# Patient Record
Sex: Female | Born: 1950
Health system: Southern US, Community
[De-identification: ages and names within clinical notes are randomized; demographics above are authoritative.]

## PROBLEM LIST (undated history)

## (undated) DIAGNOSIS — I10 Essential (primary) hypertension: Secondary | ICD-10-CM

## (undated) DIAGNOSIS — E7800 Pure hypercholesterolemia, unspecified: Secondary | ICD-10-CM

---

## 1998-02-02 ENCOUNTER — Other Ambulatory Visit: Admission: RE | Admit: 1998-02-02 | Discharge: 1998-02-02 | Payer: Self-pay | Admitting: Obstetrics and Gynecology

## 1999-01-31 ENCOUNTER — Other Ambulatory Visit: Admission: RE | Admit: 1999-01-31 | Discharge: 1999-01-31 | Payer: Self-pay | Admitting: Obstetrics and Gynecology

## 1999-06-11 ENCOUNTER — Emergency Department (HOSPITAL_COMMUNITY): Admission: EM | Admit: 1999-06-11 | Discharge: 1999-06-11 | Payer: Self-pay | Admitting: Emergency Medicine

## 2000-07-07 ENCOUNTER — Encounter: Payer: Self-pay | Admitting: Emergency Medicine

## 2000-07-07 ENCOUNTER — Emergency Department (HOSPITAL_COMMUNITY): Admission: EM | Admit: 2000-07-07 | Discharge: 2000-07-07 | Payer: Self-pay | Admitting: Emergency Medicine

## 2000-08-28 ENCOUNTER — Other Ambulatory Visit: Admission: RE | Admit: 2000-08-28 | Discharge: 2000-08-28 | Payer: Self-pay | Admitting: Family Medicine

## 2001-10-16 ENCOUNTER — Other Ambulatory Visit: Admission: RE | Admit: 2001-10-16 | Discharge: 2001-10-16 | Payer: Self-pay | Admitting: Family Medicine

## 2001-10-23 ENCOUNTER — Encounter: Payer: Self-pay | Admitting: Family Medicine

## 2001-10-23 ENCOUNTER — Ambulatory Visit (HOSPITAL_COMMUNITY): Admission: RE | Admit: 2001-10-23 | Discharge: 2001-10-23 | Payer: Self-pay | Admitting: Family Medicine

## 2002-11-12 ENCOUNTER — Ambulatory Visit (HOSPITAL_COMMUNITY): Admission: RE | Admit: 2002-11-12 | Discharge: 2002-11-12 | Payer: Self-pay | Admitting: Family Medicine

## 2002-11-12 ENCOUNTER — Other Ambulatory Visit: Admission: RE | Admit: 2002-11-12 | Discharge: 2002-11-12 | Payer: Self-pay | Admitting: Obstetrics and Gynecology

## 2002-11-12 ENCOUNTER — Encounter: Payer: Self-pay | Admitting: Family Medicine

## 2004-01-26 ENCOUNTER — Encounter: Admission: RE | Admit: 2004-01-26 | Discharge: 2004-01-26 | Payer: Self-pay | Admitting: Family Medicine

## 2004-04-06 ENCOUNTER — Ambulatory Visit: Payer: Self-pay | Admitting: Family Medicine

## 2004-04-06 ENCOUNTER — Other Ambulatory Visit: Admission: RE | Admit: 2004-04-06 | Discharge: 2004-04-06 | Payer: Self-pay | Admitting: Family Medicine

## 2004-05-18 ENCOUNTER — Ambulatory Visit: Payer: Self-pay | Admitting: Family Medicine

## 2004-05-20 ENCOUNTER — Ambulatory Visit: Payer: Self-pay | Admitting: Family Medicine

## 2004-05-26 ENCOUNTER — Ambulatory Visit: Payer: Self-pay

## 2004-06-01 ENCOUNTER — Ambulatory Visit: Payer: Self-pay | Admitting: Family Medicine

## 2005-06-20 ENCOUNTER — Ambulatory Visit: Payer: Self-pay | Admitting: Family Medicine

## 2005-06-22 ENCOUNTER — Ambulatory Visit: Payer: Self-pay | Admitting: Family Medicine

## 2005-06-22 ENCOUNTER — Other Ambulatory Visit: Admission: RE | Admit: 2005-06-22 | Discharge: 2005-06-22 | Payer: Self-pay | Admitting: Family Medicine

## 2005-06-22 ENCOUNTER — Encounter: Payer: Self-pay | Admitting: Family Medicine

## 2007-09-19 ENCOUNTER — Ambulatory Visit (HOSPITAL_COMMUNITY): Admission: RE | Admit: 2007-09-19 | Discharge: 2007-09-19 | Payer: Self-pay | Admitting: Family Medicine

## 2007-09-23 ENCOUNTER — Encounter (INDEPENDENT_AMBULATORY_CARE_PROVIDER_SITE_OTHER): Payer: Self-pay | Admitting: *Deleted

## 2008-10-22 ENCOUNTER — Other Ambulatory Visit: Admission: RE | Admit: 2008-10-22 | Discharge: 2008-10-22 | Payer: Self-pay | Admitting: Internal Medicine

## 2009-03-30 ENCOUNTER — Ambulatory Visit (HOSPITAL_COMMUNITY): Admission: RE | Admit: 2009-03-30 | Discharge: 2009-03-30 | Payer: Self-pay | Admitting: Internal Medicine

## 2010-04-01 ENCOUNTER — Ambulatory Visit (HOSPITAL_COMMUNITY): Admission: RE | Admit: 2010-04-01 | Discharge: 2010-04-01 | Payer: Self-pay | Admitting: Family Medicine

## 2011-01-06 ENCOUNTER — Other Ambulatory Visit (HOSPITAL_COMMUNITY)
Admission: RE | Admit: 2011-01-06 | Discharge: 2011-01-06 | Disposition: A | Discharge status: Home / Self Care | Payer: PRIVATE HEALTH INSURANCE | Source: Ambulatory Visit | Attending: Family Medicine | Admitting: Family Medicine

## 2011-01-06 ENCOUNTER — Other Ambulatory Visit: Payer: Self-pay | Admitting: Family Medicine

## 2011-01-06 DIAGNOSIS — Z113 Encounter for screening for infections with a predominantly sexual mode of transmission: Secondary | ICD-10-CM | POA: Insufficient documentation

## 2011-01-06 DIAGNOSIS — Z1159 Encounter for screening for other viral diseases: Secondary | ICD-10-CM | POA: Insufficient documentation

## 2011-01-06 DIAGNOSIS — Z124 Encounter for screening for malignant neoplasm of cervix: Secondary | ICD-10-CM | POA: Insufficient documentation

## 2011-04-11 ENCOUNTER — Other Ambulatory Visit (HOSPITAL_COMMUNITY): Payer: Self-pay | Admitting: Family Medicine

## 2011-04-11 DIAGNOSIS — Z1231 Encounter for screening mammogram for malignant neoplasm of breast: Secondary | ICD-10-CM

## 2011-05-23 ENCOUNTER — Ambulatory Visit (HOSPITAL_COMMUNITY): Payer: PRIVATE HEALTH INSURANCE

## 2011-06-27 ENCOUNTER — Ambulatory Visit (HOSPITAL_COMMUNITY)
Admission: RE | Admit: 2011-06-27 | Discharge: 2011-06-27 | Disposition: A | Discharge status: Home / Self Care | Payer: BC Managed Care – PPO | Source: Ambulatory Visit | Attending: Family Medicine | Admitting: Family Medicine

## 2011-06-27 DIAGNOSIS — Z1231 Encounter for screening mammogram for malignant neoplasm of breast: Secondary | ICD-10-CM | POA: Insufficient documentation

## 2012-01-16 ENCOUNTER — Other Ambulatory Visit: Payer: Self-pay | Admitting: Family Medicine

## 2012-01-16 ENCOUNTER — Other Ambulatory Visit (HOSPITAL_COMMUNITY)
Admission: RE | Admit: 2012-01-16 | Discharge: 2012-01-16 | Disposition: A | Discharge status: Home / Self Care | Payer: BC Managed Care – PPO | Source: Ambulatory Visit | Attending: Family Medicine | Admitting: Family Medicine

## 2012-01-16 DIAGNOSIS — Z1151 Encounter for screening for human papillomavirus (HPV): Secondary | ICD-10-CM | POA: Insufficient documentation

## 2012-01-16 DIAGNOSIS — Z124 Encounter for screening for malignant neoplasm of cervix: Secondary | ICD-10-CM | POA: Insufficient documentation

## 2012-06-07 ENCOUNTER — Other Ambulatory Visit (HOSPITAL_COMMUNITY): Payer: Self-pay | Admitting: Family Medicine

## 2012-06-07 DIAGNOSIS — Z1231 Encounter for screening mammogram for malignant neoplasm of breast: Secondary | ICD-10-CM

## 2012-06-27 ENCOUNTER — Ambulatory Visit (HOSPITAL_COMMUNITY)
Admission: RE | Admit: 2012-06-27 | Discharge: 2012-06-27 | Disposition: A | Discharge status: Home / Self Care | Payer: BC Managed Care – PPO | Source: Ambulatory Visit | Attending: Family Medicine | Admitting: Family Medicine

## 2012-06-27 DIAGNOSIS — Z1231 Encounter for screening mammogram for malignant neoplasm of breast: Secondary | ICD-10-CM | POA: Insufficient documentation

## 2012-08-04 ENCOUNTER — Emergency Department (HOSPITAL_COMMUNITY)
Admission: EM | Admit: 2012-08-04 | Discharge: 2012-08-04 | Disposition: A | Discharge status: Home / Self Care | Payer: BC Managed Care – PPO | Attending: Emergency Medicine | Admitting: Emergency Medicine

## 2012-08-04 ENCOUNTER — Encounter (HOSPITAL_COMMUNITY): Payer: Self-pay | Admitting: *Deleted

## 2012-08-04 ENCOUNTER — Emergency Department (HOSPITAL_COMMUNITY): Payer: BC Managed Care – PPO

## 2012-08-04 DIAGNOSIS — Z7982 Long term (current) use of aspirin: Secondary | ICD-10-CM | POA: Insufficient documentation

## 2012-08-04 DIAGNOSIS — M25512 Pain in left shoulder: Secondary | ICD-10-CM

## 2012-08-04 DIAGNOSIS — S4980XA Other specified injuries of shoulder and upper arm, unspecified arm, initial encounter: Secondary | ICD-10-CM | POA: Insufficient documentation

## 2012-08-04 DIAGNOSIS — S99919A Unspecified injury of unspecified ankle, initial encounter: Secondary | ICD-10-CM | POA: Insufficient documentation

## 2012-08-04 DIAGNOSIS — I10 Essential (primary) hypertension: Secondary | ICD-10-CM | POA: Insufficient documentation

## 2012-08-04 DIAGNOSIS — S8990XA Unspecified injury of unspecified lower leg, initial encounter: Secondary | ICD-10-CM | POA: Insufficient documentation

## 2012-08-04 DIAGNOSIS — M79672 Pain in left foot: Secondary | ICD-10-CM

## 2012-08-04 DIAGNOSIS — E7800 Pure hypercholesterolemia, unspecified: Secondary | ICD-10-CM | POA: Insufficient documentation

## 2012-08-04 DIAGNOSIS — Y939 Activity, unspecified: Secondary | ICD-10-CM | POA: Insufficient documentation

## 2012-08-04 DIAGNOSIS — X58XXXA Exposure to other specified factors, initial encounter: Secondary | ICD-10-CM | POA: Insufficient documentation

## 2012-08-04 DIAGNOSIS — S46909A Unspecified injury of unspecified muscle, fascia and tendon at shoulder and upper arm level, unspecified arm, initial encounter: Secondary | ICD-10-CM | POA: Insufficient documentation

## 2012-08-04 DIAGNOSIS — Y929 Unspecified place or not applicable: Secondary | ICD-10-CM | POA: Insufficient documentation

## 2012-08-04 DIAGNOSIS — Z79899 Other long term (current) drug therapy: Secondary | ICD-10-CM | POA: Insufficient documentation

## 2012-08-04 HISTORY — DX: Essential (primary) hypertension: I10

## 2012-08-04 HISTORY — DX: Pure hypercholesterolemia, unspecified: E78.00

## 2012-08-04 LAB — POCT I-STAT TROPONIN I

## 2012-08-04 LAB — COMPREHENSIVE METABOLIC PANEL
AST: 26 U/L (ref 0–37)
Albumin: 4 g/dL (ref 3.5–5.2)
Calcium: 9.6 mg/dL (ref 8.4–10.5)
Creatinine, Ser: 0.76 mg/dL (ref 0.50–1.10)

## 2012-08-04 LAB — CK TOTAL AND CKMB (NOT AT ARMC)
CK, MB: 2.2 ng/mL (ref 0.3–4.0)
Relative Index: INVALID (ref 0.0–2.5)

## 2012-08-04 LAB — CBC WITH DIFFERENTIAL/PLATELET
Basophils Absolute: 0.1 10*3/uL (ref 0.0–0.1)
Basophils Relative: 1 % (ref 0–1)
Eosinophils Relative: 5 % (ref 0–5)
HCT: 37.1 % (ref 36.0–46.0)
MCHC: 34.8 g/dL (ref 30.0–36.0)
MCV: 88.3 fL (ref 78.0–100.0)
Monocytes Absolute: 0.5 10*3/uL (ref 0.1–1.0)
Neutro Abs: 1.9 10*3/uL (ref 1.7–7.7)
RDW: 11.8 % (ref 11.5–15.5)

## 2012-08-04 MED ORDER — HYDROCODONE-ACETAMINOPHEN 5-325 MG PO TABS: 1.0000 | ORAL_TABLET | Freq: Three times a day (TID) | ORAL | Status: DC | PRN

## 2012-08-04 MED ORDER — HYDROCODONE-ACETAMINOPHEN 5-325 MG PO TABS
1.0000 | ORAL_TABLET | Freq: Once | ORAL | Status: AC
  Administered 2012-08-04: 1 via ORAL
  Filled 2012-08-04: qty 1

## 2012-08-04 NOTE — ED Notes (Signed)
Pt has had 325 asa before arriving to ED

## 2012-08-04 NOTE — ED Provider Notes (Signed)
Medical screening examination/treatment/procedure(s) were performed by non-physician practitioner and as supervising physician I was immediately available for consultation/collaboration.  Olivia Mackie, MD 08/04/12 [phone]

## 2012-08-04 NOTE — ED Notes (Addendum)
Lt. Foot: pinky toe swollen. Also, c/o lt. Arm pain.Marland Kitchenaching.  Statins before: been on crestor x 3 weeks; pcp stated, "might exp. Muscle cramps." grandson stepped on pts. Foot. The pain got worse throughout the night.

## 2012-08-04 NOTE — ED Notes (Signed)
Pt returned from XR, placed back on monitor. 

## 2012-08-04 NOTE — ED Provider Notes (Signed)
History     CSN: [id number]  Arrival date & time 08/04/12  0451   First MD Initiated Contact with Patient 08/04/12 [phone]      Chief Complaint  Patient presents with  . Toe Pain  . Arm Pain    (Consider location/radiation/quality/duration/timing/severity/associated sxs/prior treatment) HPI   62 year old female with history of hypertension history of hyperlipidemia presents complaining of muscle cramps. Patient reports she injured her left pinky yesterday when her grandchild step on it. This morning she was awoke with pain to her left foot in the same area that she injured. She described pain as a sharp and throbbing sensation, nonradiating, worsening with palpation. She then noticed an achy sensation throughout her left arm which radiates to her shoulder. Pain as a 5/10, persistent, worsening with movement. She did take an aspirin this morning. Pain is not related to exertion. Patient also report she was recently started on Crestor for the past 3 weeks. She has had trouble with statins medication in the past with muscle cramp. She denies any new muscle cramp. She denies fever, chills, chest pain, shortness of breath, nausea, dizziness, numbness, or rash. She denies any recent strenuous activities that can account for her arm pain. She has had pain like this in the past. She does have a family history of cardiac disease which concerns her. She denies family history of premature cardiac death. Patient was a former smoker but has quit for more than 30 years.  Past Medical History  Diagnosis Date  . High blood cholesterol   . Hypertension     Past Surgical History  Procedure Laterality Date  . Cesarean section      No family history on file.  History  Substance Use Topics  . Smoking status: Never Smoker   . Smokeless tobacco: Not on file  . Alcohol Use: Yes    OB History   Grav Para Term Preterm Abortions TAB SAB Ect Mult Living                  Review of Systems   Constitutional:       10 Systems reviewed and all are negative for acute change except as noted in the HPI.     Allergies  Review of patient's allergies indicates no known allergies.  Home Medications   Current Outpatient Rx  Name  Route  Sig  Dispense  Refill  . aspirin EC 81 MG tablet   Oral   Take 81 mg by mouth daily.         Marland Kitchen BIOTIN PO   Oral   Take 1 tablet by mouth daily.         . Calcium Carbonate-Vitamin D (CALCIUM + D PO)   Oral   Take 1 tablet by mouth daily.         Marland Kitchen conjugated estrogens (PREMARIN) vaginal cream   Vaginal   Place vaginally 2 (two) times a week. Takes on Sunday and Wednesday         . rosuvastatin (CRESTOR) 10 MG tablet   Oral   Take 10 mg by mouth daily.           BP 140/87  Pulse 73  Temp(Src) 97.6 F (36.4 C) (Oral)  Resp 16  SpO2 98%  Physical Exam  Nursing note and vitals reviewed. Constitutional: She appears well-developed and well-nourished. No distress.  Awake, alert, nontoxic appearance  HENT:  Head: Atraumatic.  Eyes: Conjunctivae are normal. Right eye exhibits no discharge. Left  eye exhibits no discharge.  Neck: Neck supple.  Cardiovascular: Normal rate, regular rhythm and intact distal pulses.   Pulmonary/Chest: Effort normal. No respiratory distress. She exhibits no tenderness.  Abdominal: Soft. There is no tenderness. There is no rebound.  Musculoskeletal: She exhibits tenderness (Mild tenderness to left anterior deltoid with range of motion without deformity. Normal grip strength, normal elbow movement.). She exhibits no edema.  ROM appears intact, no obvious focal weakness  L little toe tender on palpation without deformity or overlying skin changes.    Neurological: She is alert.  Mental status and motor strength appears intact  Skin: No rash noted.  Psychiatric: She has a normal mood and affect.    ED Course  Procedures (including critical care time)   Date: 08/04/2012  Rate: 74  Rhythm:  normal sinus rhythm  QRS Axis: normal  Intervals: normal  ST/T Wave abnormalities: normal  Conduction Disutrbances: none  Narrative Interpretation:   Old EKG Reviewed: none for comparison    6:35 AM Patient presents complaining of left toe injury after being stepped on by her grandchild yesterday. X-rays reveals no evidence of acute fracture or dislocation. Patient felt reassured after explaining x-ray result. She also complaining of left arm and shoulder pain which she noticed this morning. Pain is reproducible with range of motion. Normal EKG, troponin is negative. Doubt cardiac disease. She was recently placed on a statin medication. She has normal CK at this time, normal renal function.  Care discussed with attending, we both felt her L arm pain is less likely to be cardiac related.  Vicodin given for pain.  Pt to f/u with PCP, return precaution given.  Pt voice understanding and agrees with plan.   Labs Reviewed  CBC WITH DIFFERENTIAL - Abnormal; Notable for the following:    Neutrophils Relative 26 (*)    Lymphocytes Relative 61 (*)    Lymphs Abs 4.6 (*)    All other components within normal limits  COMPREHENSIVE METABOLIC PANEL - Abnormal; Notable for the following:    Total Bilirubin 0.2 (*)    GFR calc non Af Amer 90 (*)    All other components within normal limits  CK TOTAL AND CKMB  POCT I-STAT TROPONIN I   Dg Foot Complete Left  08/04/2012  *RADIOLOGY REPORT*  Clinical Data: Toe pain.  LEFT FOOT - COMPLETE 3+ VIEW  Comparison: No priors.  Findings: Three views of the left foot demonstrate no acute displaced fracture, subluxation, dislocation, joint or soft tissue abnormality.  IMPRESSION: 1.  No acute radiographic abnormality of the left foot.   Original Report Authenticated By: Trudie Reed, M.D.      1. Left anterior shoulder pain   2. Left foot pain       MDM  BP 139/91  Pulse 73  Temp(Src) 97.6 F (36.4 C) (Oral)  Resp 17  SpO2 98%  I have reviewed  nursing notes and vital signs. I personally reviewed the imaging tests through PACS system  I reviewed available ER/hospitalization records thought the EMR         Fayrene Helper, [place] 08/04/12 0710

## 2012-08-04 NOTE — ED Notes (Signed)
Pt transported to XR.  

## 2013-07-10 ENCOUNTER — Other Ambulatory Visit (HOSPITAL_COMMUNITY): Payer: Self-pay | Admitting: Family Medicine

## 2013-07-10 DIAGNOSIS — Z1231 Encounter for screening mammogram for malignant neoplasm of breast: Secondary | ICD-10-CM

## 2013-07-15 ENCOUNTER — Ambulatory Visit (HOSPITAL_COMMUNITY): Payer: BC Managed Care – PPO

## 2013-07-17 ENCOUNTER — Ambulatory Visit (HOSPITAL_COMMUNITY)
Admission: RE | Admit: 2013-07-17 | Discharge: 2013-07-17 | Disposition: A | Discharge status: Home / Self Care | Payer: BC Managed Care – PPO | Source: Ambulatory Visit | Attending: Family Medicine | Admitting: Family Medicine

## 2013-07-17 DIAGNOSIS — Z1231 Encounter for screening mammogram for malignant neoplasm of breast: Secondary | ICD-10-CM | POA: Insufficient documentation

## 2013-11-27 ENCOUNTER — Other Ambulatory Visit: Payer: Self-pay | Admitting: Physician Assistant

## 2013-11-27 DIAGNOSIS — R1011 Right upper quadrant pain: Secondary | ICD-10-CM

## 2013-11-28 ENCOUNTER — Ambulatory Visit
Admission: RE | Admit: 2013-11-28 | Discharge: 2013-11-28 | Disposition: A | Discharge status: Home / Self Care | Payer: BC Managed Care – PPO | Source: Ambulatory Visit | Attending: Physician Assistant | Admitting: Physician Assistant

## 2013-11-28 DIAGNOSIS — R1011 Right upper quadrant pain: Secondary | ICD-10-CM

## 2013-12-08 ENCOUNTER — Other Ambulatory Visit: Payer: Self-pay | Admitting: Gastroenterology

## 2013-12-08 DIAGNOSIS — R1011 Right upper quadrant pain: Secondary | ICD-10-CM

## 2013-12-15 ENCOUNTER — Inpatient Hospital Stay: Admission: RE | Admit: 2013-12-15 | Payer: BC Managed Care – PPO | Source: Ambulatory Visit

## 2014-09-30 ENCOUNTER — Other Ambulatory Visit (HOSPITAL_COMMUNITY): Payer: Self-pay | Admitting: Family Medicine

## 2014-09-30 DIAGNOSIS — Z1231 Encounter for screening mammogram for malignant neoplasm of breast: Secondary | ICD-10-CM

## 2014-10-07 ENCOUNTER — Ambulatory Visit (HOSPITAL_COMMUNITY): Payer: BLUE CROSS/BLUE SHIELD | Attending: Family Medicine

## 2014-10-15 ENCOUNTER — Other Ambulatory Visit: Payer: Self-pay | Admitting: Family Medicine

## 2014-10-15 DIAGNOSIS — R42 Dizziness and giddiness: Secondary | ICD-10-CM

## 2014-10-16 ENCOUNTER — Ambulatory Visit
Admission: RE | Admit: 2014-10-16 | Discharge: 2014-10-16 | Disposition: A | Discharge status: Home / Self Care | Payer: BLUE CROSS/BLUE SHIELD | Source: Ambulatory Visit | Attending: Family Medicine | Admitting: Family Medicine

## 2014-10-16 DIAGNOSIS — R42 Dizziness and giddiness: Secondary | ICD-10-CM

## 2014-12-01 ENCOUNTER — Ambulatory Visit
Payer: BLUE CROSS/BLUE SHIELD | Attending: Family Medicine | Admitting: Rehabilitative and Restorative Service Providers"

## 2014-12-01 DIAGNOSIS — G4486 Cervicogenic headache: Secondary | ICD-10-CM

## 2014-12-01 DIAGNOSIS — M542 Cervicalgia: Secondary | ICD-10-CM | POA: Insufficient documentation

## 2014-12-01 DIAGNOSIS — R42 Dizziness and giddiness: Secondary | ICD-10-CM | POA: Insufficient documentation

## 2014-12-01 DIAGNOSIS — R51 Headache: Secondary | ICD-10-CM | POA: Diagnosis present

## 2014-12-01 NOTE — Patient Instructions (Addendum)
Healthy Back - Shoulder Roll   Stand straight with arms relaxed at sides. Roll shoulders backward continuously. Do __10__ times. This exercise can also be done one shoulder at a time.  Copyright  VHI. All rights reserved. Thoracic Self-Mobilization (Supine)   With rolled towel placed lengthwise at lower ribs level, lie back on towel with arms outstretched. Hold __2 minutes. Relax. Repeat __1__ times per set. Do __1__ sets per session. Do __1-2 (as needed) sessions per day.  http://orth.exer.us/1001   Copyright  VHI. All rights reserved.   Tip Card 1.The goal of habituation training is to assist in decreasing symptoms of vertigo, dizziness, or nausea provoked by specific head and body motions. 2.These exercises may initially increase symptoms; however, be persistent and work through symptoms. With repetition and time, the exercises will assist in reducing or eliminating symptoms. 3.Exercises should be stopped and discussed with the therapist if you experience any of the following: - Sudden change or fluctuation in hearing - New onset of ringing in the ears, or increase in current intensity - Any fluid discharge from the ear - Severe pain in neck or back - Extreme nausea  Copyright  VHI. All rights reserved.  Sit to Side-Lying   Sit on edge of bed. *have 2 pillows and a towel roll.  Lie down onto the right side and hold until dizziness stops, plus 20 seconds (or 2 minutes if dizziness remains).  Return to sitting and wait until dizziness stops, plus 20 seconds.  Only to the right side. Repeat sequence 2-3 times per session. Do 2 sessions per day.  Copyright  VHI. All rights reserved.  Gaze Stabilization: Tip Card 1.Target must remain in focus, not blurry, and appear stationary while head is in motion. 2.Perform exercises with small head movements (45 to either side of midline). 3.Increase speed of head motion so long as target is in focus. 4.If you wear eyeglasses, be sure you  can see target through lens (therapist will give specific instructions for bifocal / progressive lenses). 5.These exercises may provoke dizziness or nausea. Work through these symptoms. If too dizzy, slow head movement slightly. Rest between each exercise. 6.Exercises demand concentration; avoid distractions. 7.For safety, perform standing exercises close to a counter, wall, corner, or next to someone.  Copyright  VHI. All rights reserved.  Gaze Stabilization: Standing Feet Apart   Sitting* keeping eyes on target on wall 3 feet away, tilt head down slightly and move head side to side for 5-10 times. Repeat while moving head up and down for 5-10 times. Do 2 sessions per day.   Copyright  VHI. All rights reserved.   Special Instructions: Exercises may bring on mild to moderate symptoms of dizziness, head pressure or tinnitus that resolve within 30 minutes of completing exercises. If symptoms are lasting longer than 30 minutes, modify your exercises by:  >decreasing the # of times you complete each activity >ensuring your symptoms return to baseline before moving onto the next exercise >dividing up exercises so you do not do them all in one session, but multiple short sessions throughout the day >doing them once a day until symptoms improve
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
## 2014-12-10 ENCOUNTER — Ambulatory Visit: Payer: BLUE CROSS/BLUE SHIELD | Admitting: Rehabilitative and Restorative Service Providers"

## 2014-12-15 ENCOUNTER — Ambulatory Visit: Payer: BLUE CROSS/BLUE SHIELD | Admitting: Rehabilitative and Restorative Service Providers"

## 2014-12-18 ENCOUNTER — Ambulatory Visit
Payer: BLUE CROSS/BLUE SHIELD | Attending: Family Medicine | Admitting: Rehabilitative and Restorative Service Providers"

## 2014-12-18 DIAGNOSIS — M542 Cervicalgia: Secondary | ICD-10-CM | POA: Insufficient documentation

## 2014-12-18 DIAGNOSIS — R51 Headache: Secondary | ICD-10-CM | POA: Diagnosis present

## 2014-12-18 DIAGNOSIS — R42 Dizziness and giddiness: Secondary | ICD-10-CM | POA: Diagnosis present

## 2014-12-18 DIAGNOSIS — G4486 Cervicogenic headache: Secondary | ICD-10-CM

## 2014-12-18 NOTE — Patient Instructions (Signed)
Head Press With [place] chin SLIGHTLY toward chest, keep mouth closed. Feel weight on back of head. Increase weight by pressing head down. Hold _5__ seconds. Relax. Repeat 10___ times. Surface: floor with one pillow  Copyright  VHI. All rights reserved.  Upper Cervical Flexion / Extension   Gently flex and extend upper neck by nodding head. Try to make a "long neck". Hold _3___ seconds. Repeat _5___ times per set. Do __1__ sets per session. Do __1-2__ sessions per day.  http://orth.exer.us/351   Copyright  VHI. All rights reserved.

## 2014-12-18 NOTE — Therapy (Signed)
[facility] [address] [place], [place], [address] Phone: [phone]   Fax:  [phone]  Physical Therapy Treatment  Patient Details  Name: Rolinda Impson MRN: [id number] Date of Birth: [birth year] Referring Provider:  Gavin Pound, MD  Encounter Date: 12/18/2014      PT End of Session - 12/18/14 0937    Visit Number 2   Number of Visits 8   Date for PT Re-Evaluation 02/01/15   Authorization Type check private benefits [employer]   PT Start Time 0935   PT Stop Time 1015   PT Time Calculation (min) 40 min   Activity Tolerance Patient tolerated treatment well   Behavior During Therapy [facility] for tasks assessed/performed      Past Medical History  Diagnosis Date  . High blood cholesterol   . Hypertension     Past Surgical History  Procedure Laterality Date  . Cesarean section      There were no vitals filed for this visit.  Visit Diagnosis:  Dizziness and giddiness  Neck pain  Cervicogenic headache      Subjective Assessment - 12/18/14 0933    Subjective The patient did some HEP, but not at recommended frequency or duration.  "I did a lot of shoulder shrugs".   Patient Stated Goals Reduce dizziness and return to prior level of function.   Currently in Pain? No/denies           [facility] Adult PT Treatment/Exercise - 12/18/14 1021    Posture/Postural Control   Posture/Postural Control Postural limitations   Exercises   Exercises Neck   Neck Exercises: Seated   Cervical Rotation Both;5 reps   Cervical Rotation Limitations adding gentle neck flexion/extension to movement   Shoulder Rolls 10 reps;Backwards   Neck Exercises: Supine   Neck Retraction 10 reps   Neck Retraction Limitations with one pillow   Capital Flexion 5 reps   Capital Flexion Limitations with pain, decreased ROM of movement   Cervical Rotation 5 reps   Cervical Rotation Limitations with towel roll to also increase extension for self  mobilization   Manual Therapy   Manual Therapy Joint mobilization;Soft tissue mobilization;Passive ROM;Manual Traction   Manual therapy comments tender to palpation of R scalenes, upper trap and transverse processes   Joint Mobilization cervical Grade II downglides and posterior to anterior   Soft tissue mobilization upper trap and scalnes focusing on R side   Passive ROM into sidebending and rotation with extension   Manual Traction cervical in supine position to tolerance focusing on suboccipital release   Neck Exercises: Stretches   Upper Trapezius Stretch 2 reps   Upper Trapezius Stretch Limitations with passive overpressure     NEUROMUSCULAR RE-EDUCATION: R sit<>sidelying with R head pressure and headache x 3 reps without change in symptoms with repetition Modified seated gaze x 1 to smaller ROM focusing on increased speed Attempted R epley's maneuver based on symptoms, however patient could not tolerate due to neck pain.        PT Education - 12/18/14 1018    Education provided Yes   Education Details HEP: neck flexion/extension, chin tuck supine   Person(s) Educated Patient   Methods Explanation;Demonstration;Handout   Comprehension Verbalized understanding;Returned demonstration          PT Short Term Goals - 12/02/14 0826    PT SHORT TERM GOAL #1   Title The patient will return demo HEP independently for neck stretching, neck flexibility, gaze adaptation, and habituation.   Baseline  Target date 01/01/2015   Time 4   Period Weeks   PT SHORT TERM GOAL #2   Title The patient will report dizziness improved from 7/10 at eval to < or equal to 2/10 with sit>R sidelying.   Baseline Target date 01/01/2015   Time 4   Period Weeks   PT SHORT TERM GOAL #3   Title The patient will tolerate gaze x 1 adaptation without reports of nausea, difficulty focusing x 30 seconds at self regulated pace.   Baseline Target date 01/01/2015   Time 4   Period Weeks   PT SHORT TERM GOAL #4    Title The patient will improve R cervical A/ROM rotation from 45 degrees to > or equal to 55 degrees.   Baseline Target date 01/01/2015   Time 4   Period Weeks           PT Long Term Goals - 12/02/14 6606    PT LONG TERM GOAL #1   Title The patient will return demo progression of HEP for postural stability, gaze adaptation, habituation for post d/c.   Baseline Target date 02/01/2015   Time 8   Period Weeks   PT LONG TERM GOAL #2   Title The patient will improve functional status reporting from 48% up to > or equal to 60%.   Baseline Target date 02/01/2015   Time 8   Period Weeks   PT LONG TERM GOAL #3   Title The patient will return demo sleeping positions for improved neck/postural support.   Baseline Target date 02/01/2015   Time 8   Period Weeks   PT LONG TERM GOAL #4   Title The patient will tolerate looking up to the right without c/o R sided neck pain.   Baseline Target date 02/01/2015   Time 8   Period Weeks               Plan - 12/18/14 1019    Clinical Impression Statement The patient demonstrated improvement today during session with neck ROM noting pain continues, but able to move further.  With R sidelying, patient c/o heaviness in head, however we could not treat symptomatically with Epley's due to neck limitations.  Therefore, focused today's session on improving R rotation and extension.   PT Next Visit Plan Add visual exercises (saccades and vertical tracking with targets), check R BPPV, manual techniques on neck, postural HEP   Consulted and Agree with Plan of Care Patient        Problem List There are no active problems to display for this patient.   [place], [place] 12/18/2014, 10:27 AM  [facility] [address] [place], [place], [address] Phone: [phone]   Fax:  [phone]

## 2014-12-22 ENCOUNTER — Encounter: Payer: BLUE CROSS/BLUE SHIELD | Admitting: Rehabilitative and Restorative Service Providers"

## 2014-12-25 ENCOUNTER — Ambulatory Visit: Payer: BLUE CROSS/BLUE SHIELD | Admitting: Rehabilitative and Restorative Service Providers"

## 2014-12-25 DIAGNOSIS — M542 Cervicalgia: Secondary | ICD-10-CM

## 2014-12-25 DIAGNOSIS — R42 Dizziness and giddiness: Secondary | ICD-10-CM | POA: Diagnosis not present

## 2014-12-25 NOTE — Therapy (Signed)
[facility] [address] [place], [place], [address] Phone: [phone]   Fax:  [phone]  Physical Therapy Treatment  Patient Details  Name: Brandy Ford MRN: [id number] Date of Birth: 1950-08-07 Referring Provider:  Gavin Pound, MD  Encounter Date: 12/25/2014      PT End of Session - 12/25/14 0848    Visit Number 3   Number of Visits 8   Date for PT Re-Evaluation 02/01/15   Authorization Type 30 visit limit   PT Start Time 0807   PT Stop Time 0848   PT Time Calculation (min) 41 min   Activity Tolerance Patient tolerated treatment well   Behavior During Therapy [facility] for tasks assessed/performed      Past Medical History  Diagnosis Date  . High blood cholesterol   . Hypertension     Past Surgical History  Procedure Laterality Date  . Cesarean section      There were no vitals filed for this visit.  Visit Diagnosis:  Neck pain  Dizziness and giddiness      Subjective Assessment - 12/25/14 0811    Subjective The patient notes that after leaving PT the last session, she felt a heavy headed sensation that lasted all day.  She also feels that the day after exercises in the home, she usually feels worse.  She denies neck pain, and reports a "heavy headed" sensation.   Patient Stated Goals Reduce dizziness and return to prior level of function.   Currently in Pain? No/denies      THERAPEUTIC EXERCISE: Supine neck A/ROM rotation R and L with towel roll to provide gentle extension moment Supine chin tucks x 5 reps Supine gentle neck flexion x 3 reps through portion of ROM Physioball leaning against wall with "w" scapular retraction x 10 reps Physioball leaning with UE flexion to overhead reaching for upper back extension  MANUAL: Gentle manual distraction supine for neck Soft tissue mobilization R focused for scalenes, upper trapezius  NEUROMUSCULAR RE-EDUCATION: Sit>R sidelying negative for  nystagmus, however patient c/o heavy headed sensation R side of her head Return to sit provokes lightheaded sensation Walking with horizontal head turns with R rotation increasing dizziness to 6/10 Standing 180 degree turns with symptoms increasing to 6/10 and then rest to allow to return to < or equal to 3/10         PT Education - 12/25/14 1002    Education provided Yes   Education Details HEP: divided HEP into 3 separate days to determine which exercises are increasing symptoms further   Person(s) Educated Patient   Methods Explanation;Demonstration;Handout   Comprehension Verbalized understanding;Returned demonstration          PT Short Term Goals - 12/02/14 0826    PT SHORT TERM GOAL #1   Title The patient will return demo HEP independently for neck stretching, neck flexibility, gaze adaptation, and habituation.   Baseline Target date 01/01/2015   Time 4   Period Weeks   PT SHORT TERM GOAL #2   Title The patient will report dizziness improved from 7/10 at eval to < or equal to 2/10 with sit>R sidelying.   Baseline Target date 01/01/2015   Time 4   Period Weeks   PT SHORT TERM GOAL #3   Title The patient will tolerate gaze x 1 adaptation without reports of nausea, difficulty focusing x 30 seconds at self regulated pace.   Baseline Target date 01/01/2015   Time 4   Period Weeks  PT SHORT TERM GOAL #4   Title The patient will improve R cervical A/ROM rotation from 45 degrees to > or equal to 55 degrees.   Baseline Target date 01/01/2015   Time 4   Period Weeks           PT Long Term Goals - 12/02/14 6010    PT LONG TERM GOAL #1   Title The patient will return demo progression of HEP for postural stability, gaze adaptation, habituation for post d/c.   Baseline Target date 02/01/2015   Time 8   Period Weeks   PT LONG TERM GOAL #2   Title The patient will improve functional status reporting from 48% up to > or equal to 60%.   Baseline Target date 02/01/2015   Time 8    Period Weeks   PT LONG TERM GOAL #3   Title The patient will return demo sleeping positions for improved neck/postural support.   Baseline Target date 02/01/2015   Time 8   Period Weeks   PT LONG TERM GOAL #4   Title The patient will tolerate looking up to the right without c/o R sided neck pain.   Baseline Target date 02/01/2015   Time 8   Period Weeks               Plan - 12/25/14 1011    Clinical Impression Statement The patient feels neck exercises, specifically R rotation creates an increased "heaviness" and pressure in her head on the right side.  Her symptoms increase from 0/10 at baseline up to 5-6/10 with activity in therapy.  PT educated patient on avoiding the mentality of working through dizziness and instead resting when symptoms hit "moderate" level of 4-6/10 and allowing symtpoms to settle before continuing with activity.    PT Next Visit Plan See how HEP going with dividing tasks and which activities aggravate symtpoms.  R neck rotation with  muscle energy and postural techniques, soft tissue mobilization R side cervical region   Consulted and Agree with Plan of Care Patient        Problem List There are no active problems to display for this patient.   [place], [place] 12/25/2014, 10:15 AM  [facility] [address] [place], [place], [address] Phone: [phone]   Fax:  [phone]

## 2014-12-25 NOTE — Patient Instructions (Signed)
Day 1  Healthy Back - Shoulder Roll   Stand straight with arms relaxed at sides. Roll shoulders backward continuously. Do __10__ times. This exercise can also be done one shoulder at a time.  Copyright  VHI. All rights reserved. Thoracic Self-Mobilization (Supine)   With rolled towel placed lengthwise at lower ribs level, lie back on towel with arms outstretched. Hold __2 minutes. Relax. Repeat __1__ times per set. Do __1__ sets per session. Do __1-2 (as needed) sessions per day.  http://orth.exer.us/1001   Copyright  VHI. All rights reserved.    DAY 2  Tip Card 1.The goal of habituation training is to assist in decreasing symptoms of vertigo, dizziness, or nausea provoked by specific head and body motions. 2.These exercises may initially increase symptoms; however, be persistent and work through symptoms. With repetition and time, the exercises will assist in reducing or eliminating symptoms. 3.Exercises should be stopped and discussed with the therapist if you experience any of the following: - Sudden change or fluctuation in hearing - New onset of ringing in the ears, or increase in current intensity - Any fluid discharge from the ear - Severe pain in neck or back - Extreme nausea  Copyright  VHI. All rights reserved.  Sit to Side-Lying   Sit on edge of bed. *have 2 pillows and a towel roll. Lie down onto the right side and hold until dizziness stops, plus 20 seconds (or 2 minutes if dizziness remains). Return to sitting and wait until dizziness stops, plus 20 seconds. Only to the right side. Repeat sequence 2-3 times per session. Do 2 sessions per day.  Copyright  VHI. All rights reserved.  Gaze Stabilization: Tip Card 1.Target must remain in focus, not blurry, and appear stationary while head is in motion. 2.Perform exercises with small head movements (45 to either side of midline). 3.Increase speed of head motion so long as target is in focus. 4.If you wear  eyeglasses, be sure you can see target through lens (therapist will give specific instructions for bifocal / progressive lenses). 5.These exercises may provoke dizziness or nausea. Work through these symptoms. If too dizzy, slow head movement slightly. Rest between each exercise. 6.Exercises demand concentration; avoid distractions. 7.For safety, perform standing exercises close to a counter, wall, corner, or next to someone.  Copyright  VHI. All rights reserved.  Gaze Stabilization: Standing Feet Apart   Sitting* keeping eyes on target on wall 3 feet away, tilt head down slightly and move head side to side for 5-10 times. Repeat while moving head up and down for 5-10 times. Do 2 sessions per day.   Copyright  VHI. All rights reserved.   Special Instructions: Exercises may bring on mild to moderate symptoms of dizziness, head pressure or tinnitus that resolve within 30 minutes of completing exercises. If symptoms are lasting longer than 30 minutes, modify your exercises by: >decreasing the # of times you complete each activity >ensuring your symptoms return to baseline before moving onto the next exercise >dividing up exercises so you do not do them all in one session, but multiple short sessions throughout the day >doing them once a day until symptoms improve    DAY 3 Head Press With Chin Tuck   Tuck chin SLIGHTLY toward chest, keep mouth closed. Feel weight on back of head. Increase weight by pressing head down. Hold _5__ seconds. Relax. Repeat 10___ times. Surface: floor with one pillow

## 2015-01-01 ENCOUNTER — Encounter: Payer: Self-pay | Admitting: Physical Therapy

## 2015-01-01 ENCOUNTER — Ambulatory Visit: Payer: BLUE CROSS/BLUE SHIELD | Admitting: Physical Therapy

## 2015-01-01 DIAGNOSIS — R42 Dizziness and giddiness: Secondary | ICD-10-CM

## 2015-01-01 DIAGNOSIS — M542 Cervicalgia: Secondary | ICD-10-CM

## 2015-01-01 DIAGNOSIS — R51 Headache: Secondary | ICD-10-CM

## 2015-01-01 DIAGNOSIS — G4486 Cervicogenic headache: Secondary | ICD-10-CM

## 2015-01-01 NOTE — Therapy (Signed)
[facility] [address] [place], [place], [address] Phone: [phone]   Fax:  [phone]  Physical Therapy Treatment  Patient Details  Name: Brandy Ford MRN: [id number] Date of Birth: 07-27-1950 Referring Provider:  Gavin Pound, MD  Encounter Date: 01/01/2015   01/01/15 0809  PT Visits / Re-Eval  Visit Number 4  Number of Visits 8  Date for PT Re-Evaluation 02/01/15  Authorization  Authorization Type 30 visit limit  PT Time Calculation  PT Start Time 0802  PT Stop Time 0840  PT Time Calculation (min) 38 min  PT - End of Session  Activity Tolerance Patient tolerated treatment well  Behavior During Therapy [facility] for tasks assessed/performed     Past Medical History  Diagnosis Date  . High blood cholesterol   . Hypertension     Past Surgical History  Procedure Laterality Date  . Cesarean section      There were no vitals filed for this visit.  Visit Diagnosis:  Neck pain  Dizziness and giddiness  Cervicogenic headache   01/01/15 0806  Symptoms/Limitations  Subjective No new complaints. Neck is okay. Modified exercies are going well (copies given today). Did go dancing last night, no issues so far today. Did have issue after getting hair done from hyperextending neck to get hair washed that caused her a headache/heavy head feeling. Ice helped with rest;.  Pain Assessment  Currently in Pain? No/denies     Treatment: Manual therapy: to bil upper traps, rhomboids, STM, and subscapularis in both supine and in left sidelying Soft tissue mobs Trigger point release Positional release muscle energy technique for scapular mobility and cervical spine mobility    Exercise Posterior shoulder rolls x 10 reps Scapular retraction with depression  X 10 reps, 5 sec hold Upper trap neural stretch, 30 sec's x 3 reps each way         PT Short Term Goals - 12/02/14 6073    PT SHORT TERM GOAL #1   Title  The patient will return demo HEP independently for neck stretching, neck flexibility, gaze adaptation, and habituation.   Baseline Target date 01/01/2015   Time 4   Period Weeks   PT SHORT TERM GOAL #2   Title The patient will report dizziness improved from 7/10 at eval to < or equal to 2/10 with sit>R sidelying.   Baseline Target date 01/01/2015   Time 4   Period Weeks   PT SHORT TERM GOAL #3   Title The patient will tolerate gaze x 1 adaptation without reports of nausea, difficulty focusing x 30 seconds at self regulated pace.   Baseline Target date 01/01/2015   Time 4   Period Weeks   PT SHORT TERM GOAL #4   Title The patient will improve R cervical A/ROM rotation from 45 degrees to > or equal to 55 degrees.   Baseline Target date 01/01/2015   Time 4   Period Weeks           PT Long Term Goals - 12/02/14 7106    PT LONG TERM GOAL #1   Title The patient will return demo progression of HEP for postural stability, gaze adaptation, habituation for post d/c.   Baseline Target date 02/01/2015   Time 8   Period Weeks   PT LONG TERM GOAL #2   Title The patient will improve functional status reporting from 48% up to > or equal to 60%.   Baseline Target date 02/01/2015   Time  8   Period Weeks   PT LONG TERM GOAL #3   Title The patient will return demo sleeping positions for improved neck/postural support.   Baseline Target date 02/01/2015   Time 8   Period Weeks   PT LONG TERM GOAL #4   Title The patient will tolerate looking up to the right without c/o R sided neck pain.   Baseline Target date 02/01/2015   Time 8   Period Weeks        01/01/15 0808  Plan  Clinical Impression Statement Pt with palpable trigger point in her right subscapular area, able to release some with manual therapy today. Pt making steady progress toward goals.  Pt will benefit from skilled therapeutic intervention in order to improve on the following deficits Decreased activity  tolerance;Pain;Hypomobility;Impaired flexibility;Dizziness;Decreased range of motion  Rehab Potential Good  PT Frequency 1x / week  PT Duration 8 weeks  PT Treatment/Interventions Functional mobility training;Patient/family education;Therapeutic activities;Therapeutic exercise;Balance training;Neuromuscular re-education;Vestibular;Canalith Repostioning;Manual techniques;Passive range of motion;Cryotherapy  PT Next Visit Plan Add visual exercises (saccades and vertical tracking with targets), check R BPPV, manual techniques on neck, postural HEP  Consulted and Agree with Plan of Care Patient     Problem List There are no active problems to display for this patient.   Willow Ora 01/03/2015, 8:13 PM  Willow Ora, PTA, [facility] [address], [place], [place] [address] [phone] 01/03/2015, 8:13 PM

## 2015-01-08 ENCOUNTER — Ambulatory Visit: Payer: BLUE CROSS/BLUE SHIELD | Admitting: Rehabilitative and Restorative Service Providers"

## 2015-01-13 ENCOUNTER — Ambulatory Visit
Payer: BLUE CROSS/BLUE SHIELD | Attending: Family Medicine | Admitting: Rehabilitative and Restorative Service Providers"

## 2015-01-13 ENCOUNTER — Encounter: Payer: Self-pay | Admitting: Rehabilitative and Restorative Service Providers"

## 2015-01-13 DIAGNOSIS — M542 Cervicalgia: Secondary | ICD-10-CM | POA: Diagnosis present

## 2015-01-13 DIAGNOSIS — R42 Dizziness and giddiness: Secondary | ICD-10-CM

## 2015-01-13 NOTE — Therapy (Signed)
[facility] [address] [place], [place], [address] Phone: [phone]   Fax:  [phone]  Patient Details  Name: Brandy Ford MRN: [id number] Date of Birth: 07-09-1950 Referring Provider:  No ref. provider found  Encounter Date: 01/13/2015  PHYSICAL THERAPY DISCHARGE SUMMARY  Visits from Start of Care: 5  Current functional level related to goals / functional outcomes:     PT Short Term Goals - 01/13/15 0809    PT SHORT TERM GOAL #1   Title The patient will return demo HEP independently for neck stretching, neck flexibility, gaze adaptation, and habituation.   Baseline Met on 01/13/2015.   Time 4   Period Weeks   Status Achieved   PT SHORT TERM GOAL #2   Title The patient will report dizziness improved from 7/10 at eval to < or equal to 2/10 with sit>R sidelying.   Baseline Met on 01/13/2015 with 0/10 symptoms.   Time 4   Period Weeks   Status Achieved   PT SHORT TERM GOAL #3   Title The patient will tolerate gaze x 1 adaptation without reports of nausea, difficulty focusing x 30 seconds at self regulated pace.   Baseline Met on 01/13/2015   Time 4   Period Weeks   Status Achieved   PT SHORT TERM GOAL #4   Title The patient will improve R cervical A/ROM rotation from 45 degrees to > or equal to 55 degrees.   Baseline Met on 01/13/2015 with A/ROM WNLs.   Time 4   Period Weeks   Status Achieved         PT Long Term Goals - 01/13/15 6195    PT LONG TERM GOAL #1   Title The patient will return demo progression of HEP for postural stability, gaze adaptation, habituation for post d/c.   Baseline Met on 01/13/2015   Time 8   Period Weeks   Status Achieved   PT LONG TERM GOAL #2   Title The patient will improve functional status reporting from 48% up to > or equal to 60%.   Baseline Met on 01/13/2015 with patient improving from 48% to up to 89%.   Time 8   Period Weeks   Status Achieved   PT LONG TERM GOAL #3   Title The patient will return demo sleeping positions for improved neck/postural support.   Baseline Met per patient report-no difficulty sleeping.   Time 8   Period Weeks   Status Achieved   PT LONG TERM GOAL #4   Title The patient will tolerate looking up to the right without c/o R sided neck pain.   Baseline Target date 02/01/2015   Time 8   Period Weeks   Status Achieved        Remaining deficits: none   Education / Equipment: HEP, sleeping positions.  Plan: Patient agrees to discharge.  Patient goals were met. Patient is being discharged due to meeting the stated rehab goals.  ?????         Thank you for the referral of this patient. Rudell Cobb, MPT  [name] 01/13/2015, 8:24 AM  [facility] [address] [place], [place], [address] Phone: [phone]   Fax:  [phone]

## 2015-01-13 NOTE — Therapy (Signed)
[facility] [address] [place], [place], [address] Phone: [phone]   Fax:  [phone]  Physical Therapy Treatment  Patient Details  Name: Brandy Ford MRN: [id number] Date of Birth: January 13, 1951 Referring Provider:  Gavin Pound, MD  Encounter Date: 01/13/2015      PT End of Session - 01/13/15 0816    Visit Number 5   Number of Visits 8   Date for PT Re-Evaluation 02/01/15   Authorization Type 30 visit limit   PT Start Time 0804   PT Stop Time 0815   PT Time Calculation (min) 11 min   Activity Tolerance Patient tolerated treatment well   Behavior During Therapy [facility] for tasks assessed/performed      Past Medical History  Diagnosis Date  . High blood cholesterol   . Hypertension     Past Surgical History  Procedure Laterality Date  . Cesarean section      There were no vitals filed for this visit.  Visit Diagnosis:  Neck pain  Dizziness and giddiness      Subjective Assessment - 01/13/15 0807    Subjective The patient reports she feels that symptoms are improved.  She reports no dizziness with quick movements and feels that modifying her activity level to move slower during her day have helped neck discomfort.   Patient Stated Goals Reduce dizziness and return to prior level of function.   Currently in Pain? No/denies      THERAPEUTIC EXERCISE: The patient has WNLs a/ROM neck rotation She can perform looking over R and L shoulders to diagonal patterns without pain or discomfort  NEUROMUSCULAR RE-EDUCATION: The patient performed sit>R sidelying without dizziness She tolerates standing gaze x 1 viewing x 30 seconds without any subjective complaints of visual blurring or dizziness  *patient performed functional status survey at end of session scoring 89 %.           PT Short Term Goals - 01/13/15 0809    PT SHORT TERM GOAL #1   Title The patient will return demo HEP independently for neck  stretching, neck flexibility, gaze adaptation, and habituation.   Baseline Met on 01/13/2015.   Time 4   Period Weeks   Status Achieved   PT SHORT TERM GOAL #2   Title The patient will report dizziness improved from 7/10 at eval to < or equal to 2/10 with sit>R sidelying.   Baseline Met on 01/13/2015 with 0/10 symptoms.   Time 4   Period Weeks   Status Achieved   PT SHORT TERM GOAL #3   Title The patient will tolerate gaze x 1 adaptation without reports of nausea, difficulty focusing x 30 seconds at self regulated pace.   Baseline Met on 01/13/2015   Time 4   Period Weeks   Status Achieved   PT SHORT TERM GOAL #4   Title The patient will improve R cervical A/ROM rotation from 45 degrees to > or equal to 55 degrees.   Baseline Met on 01/13/2015 with A/ROM WNLs.   Time 4   Period Weeks   Status Achieved           PT Long Term Goals - 01/13/15 0938    PT LONG TERM GOAL #1   Title The patient will return demo progression of HEP for postural stability, gaze adaptation, habituation for post d/c.   Baseline Met on 01/13/2015   Time 8   Period Weeks   Status Achieved   PT LONG TERM  GOAL #2   Title The patient will improve functional status reporting from 48% up to > or equal to 60%.   Baseline Met on 01/13/2015 with patient improving from 48% to up to 89%.   Time 8   Period Weeks   Status Achieved   PT LONG TERM GOAL #3   Title The patient will return demo sleeping positions for improved neck/postural support.   Baseline Met per patient report-no difficulty sleeping.   Time 8   Period Weeks   Status Achieved   PT LONG TERM GOAL #4   Title The patient will tolerate looking up to the right without c/o R sided neck pain.   Baseline Target date 02/01/2015   Time 8   Period Weeks   Status Achieved               Plan - 01/13/15 2025    Clinical Impression Statement The patient met all STGs and LTGs.  She feels she has returned to prior level of function.   PT Next Visit Plan  Discharge with patient meeting goals today.   Consulted and Agree with Plan of Care Patient        Problem List There are no active problems to display for this patient.   [place], [place] 01/13/2015, 8:23 AM  [facility] [address] [place], [place], [address] Phone: [phone]   Fax:  [phone]

## 2015-06-10 ENCOUNTER — Encounter: Payer: Self-pay | Admitting: Cardiology

## 2015-06-10 ENCOUNTER — Ambulatory Visit (INDEPENDENT_AMBULATORY_CARE_PROVIDER_SITE_OTHER): Payer: BLUE CROSS/BLUE SHIELD | Admitting: Cardiology

## 2015-06-10 VITALS — BP 124/86 | HR 86 | Ht 61.0 in | Wt 127.0 lb

## 2015-06-10 DIAGNOSIS — E785 Hyperlipidemia, unspecified: Secondary | ICD-10-CM

## 2015-06-10 DIAGNOSIS — Z889 Allergy status to unspecified drugs, medicaments and biological substances status: Secondary | ICD-10-CM

## 2015-06-10 DIAGNOSIS — Z79899 Other long term (current) drug therapy: Secondary | ICD-10-CM

## 2015-06-10 DIAGNOSIS — Z789 Other specified health status: Secondary | ICD-10-CM | POA: Insufficient documentation

## 2015-06-10 MED ORDER — EZETIMIBE 10 MG PO TABS: 10.0000 mg | ORAL_TABLET | Freq: Every day | ORAL | Status: DC

## 2015-06-10 NOTE — Patient Instructions (Signed)
Medication Instructions:  Zetia 10 mg a day. Continue all other medications as listed.  Labwork: Please have blood work in 3 months (Lipid/Alt)  Follow-Up: Follow up in 1 year with Dr. Marlou Porch.  You will receive a letter in the mail 2 months before you are due.  Please call us when you receive this letter to schedule your follow up appointment.  If you need a refill on your cardiac medications before your next appointment, please call your pharmacy.  Thank you for choosing [facility]!!

## 2015-06-10 NOTE — Progress Notes (Signed)
Cardiology Office Note    Date:  06/10/2015   ID:  Brandy Ford, DOB [birth year], MRN [id number]  PCP:  Aretta Nip, MD  Cardiologist:   Candee Furbish, MD     History of Present Illness:  Brandy Ford is a 65 y.o. female here for lipid clinic evaluation. Tried pravastatin 10 mg but had shortness of breath and muscle pain and stopped taking. LDL is 188. Intolerant of prior statin use. She's had a Cardiolite stress test with normal perfusion. Prior treadmill test in the past that showed some mild inferior depressions., Creatinine 0.7.  Activities director, pushing wheelchairs. [place]. Pravastatin - HA, trouble moving. SOB. Anxious. Lipitor, Crestor, Zocor  Cholesterol was discovered to be elevated 65 years old. Genetic. Aerobic instructor,. Tried Vegan. LDL only minimally reduced with plat based diet.   Zetia - worked. No symptoms. $100 a month.   Her mother died of heart failure at age 53 from rheumatic heart disease.    Past Medical History  Diagnosis Date  . High blood cholesterol   . Hypertension     Past Surgical History  Procedure Laterality Date  . Cesarean section      Outpatient Prescriptions Prior to Visit  Medication Sig Dispense Refill  . aspirin EC 81 MG tablet Take 81 mg by mouth daily.    Marland Kitchen BIOTIN PO Take 1 tablet by mouth daily.    . Calcium Carbonate-Vitamin D (CALCIUM + D PO) Take 1 tablet by mouth daily.    Marland Kitchen conjugated estrogens (PREMARIN) vaginal cream Place vaginally 2 (two) times a week. Takes on Sunday and Wednesday    . ezetimibe (ZETIA) 10 MG tablet Take 10 mg by mouth daily.    Marland Kitchen HYDROcodone-acetaminophen (NORCO/VICODIN) 5-325 MG per tablet Take 1 tablet by mouth every 8 (eight) hours as needed for pain. 6 tablet 0  . rosuvastatin (CRESTOR) 10 MG tablet Take 10 mg by mouth daily.     No facility-administered medications prior to visit.     Allergies:   Pravastatin sodium   Social History   Social History  . Marital Status:  Divorced    Spouse Name: N/A  . Number of Children: N/A  . Years of Education: N/A   Social History Main Topics  . Smoking status: Never Smoker   . Smokeless tobacco: None  . Alcohol Use: Yes  . Drug Use: No  . Sexual Activity: Not Asked   Other Topics Concern  . None   Social History Narrative     Family History:  The patient's Mother CHF at 41.    ROS:   Please see the history of present illness.    ROS  anxiety, no bleeding, no syncope, no orthopnea, no PND, no chest pain All other systems reviewed and are negative.   PHYSICAL EXAM:   VS:  BP 124/86 mmHg  Pulse 86  Ht 5\' 1"  (1.549 m)  Wt 127 lb (57.607 kg)  BMI 24.01 kg/m2   GEN: Well nourished, well developed, in no acute distress HEENT: normal Neck: no JVD, carotid bruits, or masses Cardiac: RRR; no murmurs, rubs, or gallops,no edema  Respiratory:  clear to auscultation bilaterally, normal work of breathing GI: soft, nontender, nondistended, + BS MS: no deformity or atrophy Skin: warm and dry, no rash Neuro:  Alert and Oriented x 3, Strength and sensation are intact Psych: euthymic mood, full affect  Wt Readings from Last 3 Encounters:  06/10/15 127 lb (57.607 kg)  Studies/Labs Reviewed:   EKG:  EKG is ordered today.  The ekg ordered today demonstrates normal sinus rhythm 86, no other specific abnormalities. Normal intervals. Personally viewed  Recent Labs: No results found for requested labs within last 365 days.   Lipid Panel No results found for: CHOL, TRIG, HDL, CHOLHDL, VLDL, LDLCALC, LDLDIRECT  Additional studies/ records that were reviewed today include:  Prior office visit, lab or, stress test findings.    ASSESSMENT:    No diagnosis found.   PLAN:  In order of problems listed above:  1. Hyperlipidemia-familial component. Intolerant of statins. She's tried multiples as above. Zetia has worked for her in the past. We will try generic. We will go ahead and resume 10 mg once a day.  In 3 months recheck lipid panel and ALT. I will also have her seen by the lipid clinic here. Excellent diet, lifestyle, exercise. Also consider omega-3 supplements.  2. Statin intolerance - multiple statins tried. Body aches.    Medication Adjustments/Labs and Tests Ordered: Current medicines are reviewed at length with the patient today.  Concerns regarding medicines are outlined above.  Medication changes, Labs and Tests ordered today are listed in the Patient Instructions below. There are no Patient Instructions on file for this visit.     Bobby Rumpf, MD  06/10/2015 5:04 PM    [facility] [place], [place], [place]  [address] Phone: [phone]; Fax: [phone]

## 2015-06-15 NOTE — Addendum Note (Signed)
Addended by: Freada Bergeron on: 06/15/2015 01:40 PM   Modules accepted: Orders

## 2015-09-22 ENCOUNTER — Other Ambulatory Visit (INDEPENDENT_AMBULATORY_CARE_PROVIDER_SITE_OTHER): Payer: BLUE CROSS/BLUE SHIELD | Admitting: *Deleted

## 2015-09-22 DIAGNOSIS — E785 Hyperlipidemia, unspecified: Secondary | ICD-10-CM

## 2015-09-22 DIAGNOSIS — Z79899 Other long term (current) drug therapy: Secondary | ICD-10-CM

## 2015-09-22 LAB — ALT: ALT: 15 U/L (ref 6–29)

## 2015-09-22 LAB — LIPID PANEL
Cholesterol: 196 mg/dL (ref 125–200)
HDL: 64 mg/dL (ref >=46)
LDL CALC: 112 mg/dL (ref <130)
Total CHOL/HDL Ratio: 3.1 Ratio (ref <=5.0)
Triglycerides: 99 mg/dL (ref <150)
VLDL: 20 mg/dL (ref <30)

## 2015-10-06 ENCOUNTER — Ambulatory Visit: Payer: BLUE CROSS/BLUE SHIELD

## 2015-11-25 ENCOUNTER — Other Ambulatory Visit (HOSPITAL_COMMUNITY): Payer: Self-pay | Admitting: Family Medicine

## 2015-11-25 DIAGNOSIS — Z1231 Encounter for screening mammogram for malignant neoplasm of breast: Secondary | ICD-10-CM

## 2015-11-30 ENCOUNTER — Ambulatory Visit
Admission: RE | Admit: 2015-11-30 | Discharge: 2015-11-30 | Disposition: A | Discharge status: Home / Self Care | Payer: BLUE CROSS/BLUE SHIELD | Source: Ambulatory Visit | Attending: Family Medicine | Admitting: Family Medicine

## 2015-11-30 DIAGNOSIS — Z1231 Encounter for screening mammogram for malignant neoplasm of breast: Secondary | ICD-10-CM

## 2016-02-09 ENCOUNTER — Other Ambulatory Visit: Payer: Self-pay | Admitting: Cardiology

## 2016-02-09 DIAGNOSIS — E785 Hyperlipidemia, unspecified: Secondary | ICD-10-CM

## 2016-02-09 DIAGNOSIS — Z79899 Other long term (current) drug therapy: Secondary | ICD-10-CM

## 2016-05-31 ENCOUNTER — Other Ambulatory Visit: Payer: Self-pay | Admitting: Family Medicine

## 2016-05-31 ENCOUNTER — Other Ambulatory Visit (HOSPITAL_COMMUNITY)
Admission: RE | Admit: 2016-05-31 | Discharge: 2016-05-31 | Disposition: A | Discharge status: Home / Self Care | Payer: PRIVATE HEALTH INSURANCE | Source: Ambulatory Visit | Attending: Family Medicine | Admitting: Family Medicine

## 2016-05-31 DIAGNOSIS — Z124 Encounter for screening for malignant neoplasm of cervix: Secondary | ICD-10-CM | POA: Insufficient documentation

## 2016-05-31 DIAGNOSIS — Z1151 Encounter for screening for human papillomavirus (HPV): Secondary | ICD-10-CM | POA: Diagnosis present

## 2016-06-01 LAB — CYTOLOGY - PAP
Diagnosis: NEGATIVE
HPV (WINDOPATH): NOT DETECTED

## 2016-11-09 DIAGNOSIS — H6121 Impacted cerumen, right ear: Secondary | ICD-10-CM | POA: Diagnosis not present

## 2016-11-09 DIAGNOSIS — H9191 Unspecified hearing loss, right ear: Secondary | ICD-10-CM | POA: Diagnosis not present

## 2016-11-17 ENCOUNTER — Other Ambulatory Visit: Payer: Self-pay | Admitting: Family Medicine

## 2016-11-17 DIAGNOSIS — Z1231 Encounter for screening mammogram for malignant neoplasm of breast: Secondary | ICD-10-CM

## 2016-12-04 DIAGNOSIS — D225 Melanocytic nevi of trunk: Secondary | ICD-10-CM | POA: Diagnosis not present

## 2016-12-04 DIAGNOSIS — X32XXXA Exposure to sunlight, initial encounter: Secondary | ICD-10-CM | POA: Diagnosis not present

## 2016-12-04 DIAGNOSIS — L821 Other seborrheic keratosis: Secondary | ICD-10-CM | POA: Diagnosis not present

## 2016-12-04 DIAGNOSIS — L570 Actinic keratosis: Secondary | ICD-10-CM | POA: Diagnosis not present

## 2016-12-07 ENCOUNTER — Ambulatory Visit
Admission: RE | Admit: 2016-12-07 | Discharge: 2016-12-07 | Disposition: A | Discharge status: Home / Self Care | Payer: Medicare HMO | Source: Ambulatory Visit | Attending: Family Medicine | Admitting: Family Medicine

## 2016-12-07 DIAGNOSIS — Z1231 Encounter for screening mammogram for malignant neoplasm of breast: Secondary | ICD-10-CM | POA: Diagnosis not present

## 2017-01-25 DIAGNOSIS — Z8371 Family history of colonic polyps: Secondary | ICD-10-CM | POA: Diagnosis not present

## 2017-01-25 DIAGNOSIS — K640 First degree hemorrhoids: Secondary | ICD-10-CM | POA: Diagnosis not present

## 2017-02-12 DIAGNOSIS — R69 Illness, unspecified: Secondary | ICD-10-CM | POA: Diagnosis not present

## 2017-04-09 DIAGNOSIS — J0100 Acute maxillary sinusitis, unspecified: Secondary | ICD-10-CM | POA: Diagnosis not present

## 2017-04-18 DIAGNOSIS — R05 Cough: Secondary | ICD-10-CM | POA: Diagnosis not present

## 2017-06-11 DIAGNOSIS — Z23 Encounter for immunization: Secondary | ICD-10-CM | POA: Diagnosis not present

## 2017-06-11 DIAGNOSIS — E7800 Pure hypercholesterolemia, unspecified: Secondary | ICD-10-CM | POA: Diagnosis not present

## 2017-06-11 DIAGNOSIS — Z0000 Encounter for general adult medical examination without abnormal findings: Secondary | ICD-10-CM | POA: Diagnosis not present

## 2017-06-21 DIAGNOSIS — R300 Dysuria: Secondary | ICD-10-CM | POA: Diagnosis not present

## 2017-06-21 DIAGNOSIS — N3001 Acute cystitis with hematuria: Secondary | ICD-10-CM | POA: Diagnosis not present

## 2017-06-21 DIAGNOSIS — R319 Hematuria, unspecified: Secondary | ICD-10-CM | POA: Diagnosis not present

## 2017-07-23 DIAGNOSIS — M62838 Other muscle spasm: Secondary | ICD-10-CM | POA: Diagnosis not present

## 2017-07-23 DIAGNOSIS — M5441 Lumbago with sciatica, right side: Secondary | ICD-10-CM | POA: Diagnosis not present

## 2017-09-24 DIAGNOSIS — J309 Allergic rhinitis, unspecified: Secondary | ICD-10-CM | POA: Diagnosis not present

## 2017-11-05 DIAGNOSIS — R69 Illness, unspecified: Secondary | ICD-10-CM | POA: Diagnosis not present

## 2017-11-12 DIAGNOSIS — H01003 Unspecified blepharitis right eye, unspecified eyelid: Secondary | ICD-10-CM | POA: Diagnosis not present

## 2017-11-12 DIAGNOSIS — H2513 Age-related nuclear cataract, bilateral: Secondary | ICD-10-CM | POA: Diagnosis not present

## 2017-11-12 DIAGNOSIS — H25013 Cortical age-related cataract, bilateral: Secondary | ICD-10-CM | POA: Diagnosis not present

## 2017-11-12 DIAGNOSIS — H5203 Hypermetropia, bilateral: Secondary | ICD-10-CM | POA: Diagnosis not present

## 2017-11-12 DIAGNOSIS — H04123 Dry eye syndrome of bilateral lacrimal glands: Secondary | ICD-10-CM | POA: Diagnosis not present

## 2017-11-29 DIAGNOSIS — H5203 Hypermetropia, bilateral: Secondary | ICD-10-CM | POA: Diagnosis not present

## 2018-01-08 ENCOUNTER — Other Ambulatory Visit: Payer: Self-pay | Admitting: Family Medicine

## 2018-01-08 DIAGNOSIS — Z1231 Encounter for screening mammogram for malignant neoplasm of breast: Secondary | ICD-10-CM

## 2018-02-11 ENCOUNTER — Ambulatory Visit
Admission: RE | Admit: 2018-02-11 | Discharge: 2018-02-11 | Disposition: A | Discharge status: Home / Self Care | Payer: Medicare HMO | Source: Ambulatory Visit | Attending: Family Medicine | Admitting: Family Medicine

## 2018-02-11 DIAGNOSIS — Z1231 Encounter for screening mammogram for malignant neoplasm of breast: Secondary | ICD-10-CM

## 2018-02-12 ENCOUNTER — Other Ambulatory Visit: Payer: Self-pay | Admitting: Family Medicine

## 2018-02-12 DIAGNOSIS — R928 Other abnormal and inconclusive findings on diagnostic imaging of breast: Secondary | ICD-10-CM

## 2018-02-12 DIAGNOSIS — R69 Illness, unspecified: Secondary | ICD-10-CM | POA: Diagnosis not present

## 2018-02-18 ENCOUNTER — Other Ambulatory Visit: Payer: Self-pay | Admitting: Family Medicine

## 2018-02-18 ENCOUNTER — Ambulatory Visit
Admission: RE | Admit: 2018-02-18 | Discharge: 2018-02-18 | Disposition: A | Discharge status: Home / Self Care | Payer: Medicare HMO | Source: Ambulatory Visit | Attending: Family Medicine | Admitting: Family Medicine

## 2018-02-18 DIAGNOSIS — R928 Other abnormal and inconclusive findings on diagnostic imaging of breast: Secondary | ICD-10-CM

## 2018-02-25 ENCOUNTER — Emergency Department (HOSPITAL_COMMUNITY): Payer: Medicare HMO

## 2018-02-25 ENCOUNTER — Encounter (HOSPITAL_COMMUNITY): Payer: Self-pay | Admitting: Emergency Medicine

## 2018-02-25 ENCOUNTER — Emergency Department (HOSPITAL_COMMUNITY)
Admission: EM | Admit: 2018-02-25 | Discharge: 2018-02-26 | Disposition: A | Discharge status: Home / Self Care | Payer: Medicare HMO | Attending: Emergency Medicine | Admitting: Emergency Medicine

## 2018-02-25 DIAGNOSIS — N390 Urinary tract infection, site not specified: Secondary | ICD-10-CM | POA: Insufficient documentation

## 2018-02-25 DIAGNOSIS — R51 Headache: Secondary | ICD-10-CM | POA: Diagnosis not present

## 2018-02-25 DIAGNOSIS — I10 Essential (primary) hypertension: Secondary | ICD-10-CM | POA: Diagnosis not present

## 2018-02-25 DIAGNOSIS — R002 Palpitations: Secondary | ICD-10-CM | POA: Diagnosis not present

## 2018-02-25 DIAGNOSIS — S0990XA Unspecified injury of head, initial encounter: Secondary | ICD-10-CM | POA: Diagnosis not present

## 2018-02-25 DIAGNOSIS — Z7982 Long term (current) use of aspirin: Secondary | ICD-10-CM | POA: Diagnosis not present

## 2018-02-25 DIAGNOSIS — R0602 Shortness of breath: Secondary | ICD-10-CM | POA: Diagnosis not present

## 2018-02-25 DIAGNOSIS — R0600 Dyspnea, unspecified: Secondary | ICD-10-CM | POA: Diagnosis not present

## 2018-02-25 DIAGNOSIS — Z79899 Other long term (current) drug therapy: Secondary | ICD-10-CM | POA: Diagnosis not present

## 2018-02-25 DIAGNOSIS — R030 Elevated blood-pressure reading, without diagnosis of hypertension: Secondary | ICD-10-CM

## 2018-02-25 DIAGNOSIS — R9431 Abnormal electrocardiogram [ECG] [EKG]: Secondary | ICD-10-CM | POA: Diagnosis not present

## 2018-02-25 LAB — CBC
HEMATOCRIT: 40.7 % (ref 36.0–46.0)
HEMOGLOBIN: 13.5 g/dL (ref 12.0–15.0)
MCH: 30.2 pg (ref 26.0–34.0)
MCHC: 33.2 g/dL (ref 30.0–36.0)
MCV: 91.1 fL (ref 80.0–100.0)
NRBC: 0 % (ref 0.0–0.2)
Platelets: 242 10*3/uL (ref 150–400)
RBC: 4.47 MIL/uL (ref 3.87–5.11)
RDW: 11 % — ABNORMAL LOW (ref 11.5–15.5)
WBC: 10 10*3/uL (ref 4.0–10.5)

## 2018-02-25 LAB — BASIC METABOLIC PANEL
ANION GAP: 7 (ref 5–15)
BUN: 12 mg/dL (ref 8–23)
CHLORIDE: 105 mmol/L (ref 98–111)
CO2: 27 mmol/L (ref 22–32)
Calcium: 9.8 mg/dL (ref 8.9–10.3)
Creatinine, Ser: 0.7 mg/dL (ref 0.44–1.00)
GFR calc non Af Amer: >60 mL/min (ref >60)
Glucose, Bld: 90 mg/dL (ref 70–99)
POTASSIUM: 4 mmol/L (ref 3.5–5.1)
Sodium: 139 mmol/L (ref 135–145)

## 2018-02-25 LAB — I-STAT TROPONIN, ED: TROPONIN I, POC: 0.01 ng/mL (ref 0.00–0.08)

## 2018-02-25 NOTE — ED Provider Notes (Signed)
Patient placed in Quick Look pathway, seen and evaluated   Chief Complaint: chest pain  HPI: Brandy Ford is a 67 y.o. female who presents with 2 days of palpitations, sob; went to [facility] and they told her her BP was high; pt does not have hx of any of these things  ROS: Resp: shortness of breath  Physical Exam:  BP (!) 164/101 (BP Location: Right Arm)   Pulse 75   Temp 98.1 F (36.7 C) (Oral)   Resp 16   Ht 5\' 1"  (1.549 m)   Wt 53.5 kg   SpO2 98%   BMI 22.30 kg/m    Gen: No distress  Neuro: Awake and Alert  Skin: Warm and dry  Resp: no distress    Initiation of care has begun. The patient has been counseled on the process, plan, and necessity for staying for the completion/evaluation, and the remainder of the medical screening examination    Ashley Murrain, NP 02/25/18 [place], MD 02/26/18 1425

## 2018-02-25 NOTE — ED Triage Notes (Signed)
Pt presents with 2 days of palpitations, sob; went to [facility] and they told her her BP was high; pt does not have hx of any of these things;

## 2018-02-26 ENCOUNTER — Emergency Department (HOSPITAL_COMMUNITY): Payer: Medicare HMO

## 2018-02-26 DIAGNOSIS — Z79899 Other long term (current) drug therapy: Secondary | ICD-10-CM | POA: Diagnosis not present

## 2018-02-26 DIAGNOSIS — I10 Essential (primary) hypertension: Secondary | ICD-10-CM | POA: Diagnosis not present

## 2018-02-26 DIAGNOSIS — R002 Palpitations: Secondary | ICD-10-CM | POA: Diagnosis not present

## 2018-02-26 DIAGNOSIS — S0990XA Unspecified injury of head, initial encounter: Secondary | ICD-10-CM | POA: Diagnosis not present

## 2018-02-26 DIAGNOSIS — N390 Urinary tract infection, site not specified: Secondary | ICD-10-CM | POA: Diagnosis not present

## 2018-02-26 DIAGNOSIS — R51 Headache: Secondary | ICD-10-CM | POA: Diagnosis not present

## 2018-02-26 DIAGNOSIS — Z7982 Long term (current) use of aspirin: Secondary | ICD-10-CM | POA: Diagnosis not present

## 2018-02-26 LAB — URINALYSIS, ROUTINE W REFLEX MICROSCOPIC
Bilirubin Urine: NEGATIVE
GLUCOSE, UA: NEGATIVE mg/dL
KETONES UR: 5 mg/dL — AB
NITRITE: NEGATIVE
PH: 6 (ref 5.0–8.0)
Protein, ur: NEGATIVE mg/dL
SPECIFIC GRAVITY, URINE: 1.012 (ref 1.005–1.030)
WBC, UA: >50 WBC/hpf — ABNORMAL HIGH (ref 0–5)

## 2018-02-26 LAB — D-DIMER, QUANTITATIVE: D-Dimer, Quant: 0.33 ug/mL-FEU (ref 0.00–0.50)

## 2018-02-26 LAB — I-STAT TROPONIN, ED: Troponin i, poc: 0 ng/mL (ref 0.00–0.08)

## 2018-02-26 LAB — T4, FREE: Free T4: 0.79 ng/dL — ABNORMAL LOW (ref 0.82–1.77)

## 2018-02-26 LAB — CK: CK TOTAL: 79 U/L (ref 38–234)

## 2018-02-26 LAB — TSH: TSH: 4.138 u[IU]/mL (ref 0.350–4.500)

## 2018-02-26 MED ORDER — CEPHALEXIN 500 MG PO CAPS: 500.0000 mg | ORAL_CAPSULE | Freq: Two times a day (BID) | ORAL | 0 refills | Status: DC

## 2018-02-26 MED ORDER — AMLODIPINE BESYLATE 5 MG PO TABS: 5.0000 mg | ORAL_TABLET | Freq: Every day | ORAL | 0 refills | Status: AC

## 2018-02-26 MED ORDER — AMLODIPINE BESYLATE 5 MG PO TABS
5.0000 mg | ORAL_TABLET | Freq: Once | ORAL | Status: AC
  Administered 2018-02-26: 5 mg via ORAL
  Filled 2018-02-26: qty 1

## 2018-02-26 MED ORDER — SODIUM CHLORIDE 0.9 % IV BOLUS
1000.0000 mL | Freq: Once | INTRAVENOUS | Status: AC
  Administered 2018-02-26: 1000 mL via INTRAVENOUS

## 2018-02-26 MED ORDER — CEPHALEXIN 250 MG PO CAPS
500.0000 mg | ORAL_CAPSULE | Freq: Once | ORAL | Status: AC
  Administered 2018-02-26: 500 mg via ORAL
  Filled 2018-02-26: qty 2

## 2018-02-26 NOTE — Discharge Instructions (Addendum)
Your testing today shows no damage to your heart and no blood clot in the lung.  Keep a record of your blood pressure as we discussed and follow-up with your doctor.  Take the antibiotic for possible urinary tract infection.  Return to the ED with worsening symptoms.  Return to the ED if your chest pain becomes exertional, is associated with sweating, vomiting, shortness of breath or any other concerns.

## 2018-02-26 NOTE — ED Provider Notes (Signed)
[facility] EMERGENCY DEPARTMENT Provider Note   CSN: [id number] Arrival date & time: 02/25/18  1650     History   Chief Complaint Chief Complaint  Patient presents with  . Hypertension  . Palpitations  . Shortness of Breath    HPI Brandy Ford is a 67 y.o. female.  Patient sent from PCP with a 2-day history of fatigue, palpitations, shortness of breath and generalized weakness.  States is not felt well for the past 2 days due to generalized fatigue, shortness of breath worse when she walks around as well as fast heartbeat.  Reports her blood pressure has been elevated at home up to 166 systolic and she does not take medication for this.  Complains of diffuse headache gradual in onset x2 days.  No focal weakness, numbness or tingling.  No bowel or bladder incontinence.  No fever.  She has intermittent pain in her chest that comes and goes lasting for several minutes at a time that is not exertional or pleuritic.  She denies any cardiac history.  She takes medication for cholesterol.  No sick contacts at home.  Has had a poor appetite but no vomiting or diarrhea.  No recent medication changes.  No fever or flulike symptoms.  States she was able to go to yoga this morning and normally walks 3 miles without a problem but has not done so the past several days.  The history is provided by the patient.  Hypertension  Associated symptoms include headaches and shortness of breath. Pertinent negatives include no abdominal pain.  Palpitations   Associated symptoms include headaches, weakness and shortness of breath. Pertinent negatives include no fever, no abdominal pain, no nausea, no vomiting and no dizziness.  Shortness of Breath  Associated symptoms include headaches. Pertinent negatives include no fever, no rhinorrhea, no neck pain, no vomiting, no abdominal pain and no rash.    Past Medical History:  Diagnosis Date  . High blood cholesterol   . Hypertension      Patient Active Problem List   Diagnosis Date Noted  . Hyperlipidemia 06/10/2015  . Statin intolerance 06/10/2015    Past Surgical History:  Procedure Laterality Date  . CESAREAN SECTION       OB History   None      Home Medications    Prior to Admission medications   Medication Sig Start Date End Date Taking? Authorizing Provider  aspirin EC 81 MG tablet Take 81 mg by mouth daily.    [provider]  BIOTIN PO Take 1 tablet by mouth daily.    [provider]  Calcium Carbonate-Vitamin D (CALCIUM + D PO) Take 1 tablet by mouth daily.    [provider]  ezetimibe (ZETIA) 10 MG tablet take 1 tablet by mouth once daily 02/10/16   Jerline Pain, MD    Family History History reviewed. No pertinent family history.  Social History Social History   Tobacco Use  . Smoking status: Never Smoker  Substance Use Topics  . Alcohol use: Yes  . Drug use: No     Allergies   Pravastatin sodium   Review of Systems Review of Systems  Constitutional: Positive for activity change, appetite change and fatigue. Negative for fever.  HENT: Negative for congestion and rhinorrhea.   Eyes: Negative for visual disturbance.  Respiratory: Positive for shortness of breath. Negative for chest tightness.   Cardiovascular: Positive for palpitations.  Gastrointestinal: Negative for abdominal pain, nausea and vomiting.  Genitourinary: Negative  for dysuria, hematuria, vaginal bleeding and vaginal discharge.  Musculoskeletal: Negative for neck pain.  Skin: Negative for rash.  Neurological: Positive for weakness and headaches. Negative for dizziness and light-headedness.   all other systems are negative except as noted in the HPI and PMH.     Physical Exam Updated Vital Signs BP (!) 174/93 (BP Location: Right Arm)   Pulse 67   Temp 97.9 F (36.6 C) (Oral)   Resp 14   Ht 5\' 1"  (1.549 m)   Wt 53.5 kg   SpO2 100%   BMI 22.30 kg/m   Physical Exam   Constitutional: She is oriented to person, place, and time. She appears well-developed and well-nourished. No distress.  HENT:  Head: Normocephalic and atraumatic.  Mouth/Throat: Oropharynx is clear and moist. No oropharyngeal exudate.  Eyes: Pupils are equal, round, and reactive to light. Conjunctivae and EOM are normal.  Neck: Normal range of motion. Neck supple.  No meningismus.  Cardiovascular: Normal rate, regular rhythm, normal heart sounds and intact distal pulses.  No murmur heard. Pulmonary/Chest: Effort normal and breath sounds normal. No respiratory distress.  Abdominal: Soft. There is no tenderness. There is no rebound and no guarding.  Musculoskeletal: Normal range of motion. She exhibits no edema or tenderness.  Neurological: She is alert and oriented to person, place, and time. No cranial nerve deficit. She exhibits normal muscle tone. Coordination normal.  No ataxia on finger to nose bilaterally. No pronator drift. 5/5 strength throughout. CN 2-12 intact.Equal grip strength. Sensation intact.   Skin: Skin is warm.  Psychiatric: She has a normal mood and affect. Her behavior is normal.  Nursing note and vitals reviewed.    ED Treatments / Results  Labs (all labs ordered are listed, but only abnormal results are displayed) Labs Reviewed  CBC - Abnormal; Notable for the following components:      Result Value   RDW 11.0 (*)    All other components within normal limits  URINALYSIS, ROUTINE W REFLEX MICROSCOPIC - Abnormal; Notable for the following components:   APPearance HAZY (*)    Hgb urine dipstick SMALL (*)    Ketones, ur 5 (*)    Leukocytes, UA LARGE (*)    WBC, UA >50 (*)    Bacteria, UA RARE (*)    All other components within normal limits  T4, FREE - Abnormal; Notable for the following components:   Free T4 0.79 (*)    All other components within normal limits  URINE CULTURE  BASIC METABOLIC PANEL  D-DIMER, QUANTITATIVE (NOT AT [facility])  CK  TSH  I-STAT  TROPONIN, ED  I-STAT TROPONIN, ED    EKG EKG Interpretation  Date/Time:  Monday February 25 2018 16:59:16 EDT Ventricular Rate:  71 PR Interval:  106 QRS Duration: 78 QT Interval:  388 QTC Calculation: 421 R Axis:   84 Text Interpretation:  Sinus rhythm with short PR Septal infarct , age undetermined Abnormal ECG No significant change was found Confirmed by Ezequiel Essex [phone]) on 02/26/2018 12:33:23 AM   Radiology Dg Chest 2 View  Result Date: 02/25/2018 CLINICAL DATA:  Palpitations and shortness of breath for 2 days. History of hypertension. EXAM: CHEST - 2 VIEW COMPARISON:  None. FINDINGS: Cardiomediastinal silhouette is normal. No pleural effusions or focal consolidations. Trachea projects midline and there is no pneumothorax. Soft tissue planes and included osseous structures are non-suspicious. Mild thoracolumbar levoscoliosis may be positional. IMPRESSION: No acute cardiopulmonary process. Electronically Signed   By: Thana Farr.D.  On: 02/25/2018 18:05   Ct Head Wo Contrast  Result Date: 02/26/2018 CLINICAL DATA:  Headache and hypertension.  Head trauma. EXAM: CT HEAD WITHOUT CONTRAST TECHNIQUE: Contiguous axial images were obtained from the base of the skull through the vertex without intravenous contrast. COMPARISON:  Head CT 10/16/2014 FINDINGS: Brain: Brain volume is normal for age. No intracranial hemorrhage, mass effect, or midline shift. No hydrocephalus. The basilar cisterns are patent. No evidence of territorial infarct or acute ischemia. No extra-axial or intracranial fluid collection. Vascular: No hyperdense vessel. Skull: No fracture or focal lesion. Sinuses/Orbits: Improved mucosal thickening of the ethmoid air cells. No sinus fluid level. Visualized orbits are unremarkable. Other: None. IMPRESSION: Negative head CT for age. Electronically Signed   By: Keith Rake M.D.   On: 02/26/2018 04:38    Procedures Procedures (including critical care  time)  Medications Ordered in ED Medications  sodium chloride 0.9 % bolus 1,000 mL (has no administration in time range)     Initial Impression / Assessment and Plan / ED Course  I have reviewed the triage vital signs and the nursing notes.  Pertinent labs & imaging results that were available during my care of the patient were reviewed by me and considered in my medical decision making (see chart for details).    2 days of fatigue, palpitations and intermittent shortness of breath. No chest pain. No focal neuro deficits. CT head negative.  EKG is sinus rhythm with septal Q waves that is unchanged. Labs show normal hemoglobin.  Labs show no evidence of endorgan damage.  Troponin negative x2, d-dimer negative.  Low suspicion for ACS or pulmonary embolism.  On requestioning, patient denies chest pain or pressure.  She states she is more having palpitations associated with her shortness of breath.  Her reassuring work-up discussed with her including normal TSH, troponins and d-dimer.  Persistently hypertensive throughout her ED stay without history of same.  Will start low-dose amlodipine.  Patient to start keeping blood pressure log at home and follow-up with her PCP regarding this. Orthostatics negative. We will treat possible UTI.  Work-up reassuring.  Negative troponin x2, negative d-dimer.  Blood pressure has improved in the ED.  Advised patient to keep a close record of her blood pressure and follow-up with her PCP. She is tolerating p.o. and ambulatory. Return precautions discussed.  Final Clinical Impressions(s) / ED Diagnoses   Final diagnoses:  Palpitations  Elevated blood pressure reading  Urinary tract infection without hematuria, site unspecified    ED Discharge Orders    None       Ah Bott, Annie Main, MD 02/26/18 [phone]

## 2018-02-27 LAB — URINE CULTURE: Culture: <10000 — AB

## 2018-03-13 DIAGNOSIS — N898 Other specified noninflammatory disorders of vagina: Secondary | ICD-10-CM | POA: Diagnosis not present

## 2018-03-13 DIAGNOSIS — R350 Frequency of micturition: Secondary | ICD-10-CM | POA: Diagnosis not present

## 2018-03-18 ENCOUNTER — Other Ambulatory Visit: Payer: Self-pay | Admitting: Family Medicine

## 2018-03-18 DIAGNOSIS — N6489 Other specified disorders of breast: Secondary | ICD-10-CM

## 2018-03-25 ENCOUNTER — Other Ambulatory Visit: Payer: Medicare HMO

## 2018-04-19 DIAGNOSIS — H8113 Benign paroxysmal vertigo, bilateral: Secondary | ICD-10-CM | POA: Diagnosis not present

## 2018-07-08 DIAGNOSIS — E7800 Pure hypercholesterolemia, unspecified: Secondary | ICD-10-CM | POA: Diagnosis not present

## 2018-07-08 DIAGNOSIS — Z23 Encounter for immunization: Secondary | ICD-10-CM | POA: Diagnosis not present

## 2018-07-08 DIAGNOSIS — Z0000 Encounter for general adult medical examination without abnormal findings: Secondary | ICD-10-CM | POA: Diagnosis not present

## 2018-07-08 DIAGNOSIS — M8588 Other specified disorders of bone density and structure, other site: Secondary | ICD-10-CM | POA: Diagnosis not present

## 2018-07-09 ENCOUNTER — Other Ambulatory Visit: Payer: Self-pay | Admitting: Family Medicine

## 2018-07-09 DIAGNOSIS — E2839 Other primary ovarian failure: Secondary | ICD-10-CM

## 2018-09-16 ENCOUNTER — Other Ambulatory Visit: Payer: Medicare HMO

## 2018-10-15 DIAGNOSIS — R195 Other fecal abnormalities: Secondary | ICD-10-CM | POA: Diagnosis not present

## 2018-11-07 ENCOUNTER — Ambulatory Visit
Admission: RE | Admit: 2018-11-07 | Discharge: 2018-11-07 | Disposition: A | Discharge status: Home / Self Care | Payer: Medicare HMO | Source: Ambulatory Visit | Attending: Family Medicine | Admitting: Family Medicine

## 2018-11-07 ENCOUNTER — Other Ambulatory Visit: Payer: Self-pay

## 2018-11-07 DIAGNOSIS — Z780 Asymptomatic menopausal state: Secondary | ICD-10-CM | POA: Diagnosis not present

## 2018-11-07 DIAGNOSIS — E2839 Other primary ovarian failure: Secondary | ICD-10-CM

## 2018-11-07 DIAGNOSIS — M8589 Other specified disorders of bone density and structure, multiple sites: Secondary | ICD-10-CM | POA: Diagnosis not present

## 2018-11-18 DIAGNOSIS — H35363 Drusen (degenerative) of macula, bilateral: Secondary | ICD-10-CM | POA: Diagnosis not present

## 2018-11-18 DIAGNOSIS — H04123 Dry eye syndrome of bilateral lacrimal glands: Secondary | ICD-10-CM | POA: Diagnosis not present

## 2018-11-18 DIAGNOSIS — H2513 Age-related nuclear cataract, bilateral: Secondary | ICD-10-CM | POA: Diagnosis not present

## 2018-11-18 DIAGNOSIS — H25013 Cortical age-related cataract, bilateral: Secondary | ICD-10-CM | POA: Diagnosis not present

## 2018-12-03 DIAGNOSIS — H5203 Hypermetropia, bilateral: Secondary | ICD-10-CM | POA: Diagnosis not present

## 2018-12-11 DIAGNOSIS — R000 Tachycardia, unspecified: Secondary | ICD-10-CM | POA: Diagnosis not present

## 2019-01-31 DIAGNOSIS — R69 Illness, unspecified: Secondary | ICD-10-CM | POA: Diagnosis not present

## 2019-06-11 DIAGNOSIS — R413 Other amnesia: Secondary | ICD-10-CM | POA: Diagnosis not present

## 2019-06-11 DIAGNOSIS — E875 Hyperkalemia: Secondary | ICD-10-CM | POA: Diagnosis not present

## 2019-06-11 DIAGNOSIS — Z7982 Long term (current) use of aspirin: Secondary | ICD-10-CM | POA: Diagnosis not present

## 2019-06-11 DIAGNOSIS — Z0000 Encounter for general adult medical examination without abnormal findings: Secondary | ICD-10-CM | POA: Diagnosis not present

## 2019-06-11 DIAGNOSIS — E7800 Pure hypercholesterolemia, unspecified: Secondary | ICD-10-CM | POA: Diagnosis not present

## 2019-06-12 ENCOUNTER — Other Ambulatory Visit: Payer: Self-pay | Admitting: Internal Medicine

## 2019-06-12 DIAGNOSIS — R413 Other amnesia: Secondary | ICD-10-CM

## 2019-06-12 DIAGNOSIS — E875 Hyperkalemia: Secondary | ICD-10-CM | POA: Diagnosis not present

## 2019-06-13 DIAGNOSIS — E875 Hyperkalemia: Secondary | ICD-10-CM | POA: Diagnosis not present

## 2019-06-17 ENCOUNTER — Other Ambulatory Visit: Payer: Self-pay | Admitting: Internal Medicine

## 2019-06-17 ENCOUNTER — Other Ambulatory Visit: Payer: Self-pay | Admitting: Family Medicine

## 2019-06-17 DIAGNOSIS — Z1231 Encounter for screening mammogram for malignant neoplasm of breast: Secondary | ICD-10-CM

## 2019-06-19 ENCOUNTER — Other Ambulatory Visit: Payer: Medicare HMO

## 2019-07-25 DIAGNOSIS — E7800 Pure hypercholesterolemia, unspecified: Secondary | ICD-10-CM | POA: Diagnosis not present

## 2019-07-25 DIAGNOSIS — M545 Low back pain: Secondary | ICD-10-CM | POA: Diagnosis not present

## 2019-07-25 DIAGNOSIS — M533 Sacrococcygeal disorders, not elsewhere classified: Secondary | ICD-10-CM | POA: Diagnosis not present

## 2019-07-28 ENCOUNTER — Other Ambulatory Visit: Payer: Self-pay

## 2019-07-28 ENCOUNTER — Ambulatory Visit
Admission: RE | Admit: 2019-07-28 | Discharge: 2019-07-28 | Disposition: A | Discharge status: Home / Self Care | Payer: Medicare HMO | Source: Ambulatory Visit | Attending: Internal Medicine | Admitting: Internal Medicine

## 2019-07-28 DIAGNOSIS — Z1231 Encounter for screening mammogram for malignant neoplasm of breast: Secondary | ICD-10-CM

## 2019-08-01 ENCOUNTER — Ambulatory Visit (INDEPENDENT_AMBULATORY_CARE_PROVIDER_SITE_OTHER): Payer: Medicare HMO | Admitting: Family Medicine

## 2019-08-01 ENCOUNTER — Encounter: Payer: Self-pay | Admitting: Family Medicine

## 2019-08-01 ENCOUNTER — Other Ambulatory Visit: Payer: Self-pay

## 2019-08-01 VITALS — BP 140/90 | HR 68 | Ht 61.0 in | Wt 137.0 lb

## 2019-08-01 DIAGNOSIS — S39012A Strain of muscle, fascia and tendon of lower back, initial encounter: Secondary | ICD-10-CM

## 2019-08-01 NOTE — Progress Notes (Signed)
    Subjective:    CC: Low back pain  I, Brandy Ford, LAT, ATC, am serving as scribe for Dr. Lynne Leader.  HPI: Pt is a 69 y/o female presenting w/ c/o low back pain due to caring for her handicapped son that requires a lot of lifting and carrying.  Her pain is slightly worse on the R. Describes pain as a burning sensation and tight. Worse in the morning. The muscle relaxer helps but makes her sleepy takes half a pill twice daily. States the pain is better than it was when it first started but not going away.  Radiating pain: no LE numbness/tingling: No LE weakness: No Aggravating factors: Lifting and carrying her disabled son; Treatments tried: Muscle relaxer, ibuprofen, voltaren, copper brace   Imaging: L-spine and SIJ XR at Dr. Pennie Banter office  Pertinent review of Systems: No fevers or chills  Relevant historical information: Statin intolerance hyperlipidemia   Objective:    Vitals:   08/01/19 1454  BP: 140/90  Pulse: 68  SpO2: 95%   General: Well Developed, well nourished, and in no acute distress.   MSK: L-spine: Nontender spinal midline.  Tender palpation lumbar paraspinal musculature.  Paraspinal muscles are rigid. Lumbar motion relatively normal. Lower extremity strength reflexes and sensation intact throughout. Normal gait.  Lab and Radiology Results  EXAM: LUMBAR SPINE - 2-3 VIEW 07/25/19  COMPARISON: None.  FINDINGS: Minimal levo scoliotic curvature of the upper lumbar spine. No listhesis. Vertebral body heights are preserved. Disc space narrowing and endplate spurring at 075-GRM. Mild lower lumbar facet hypertrophy. No evidence of focal bone lesion. No fracture.  IMPRESSION: 1. Minimal scoliotic curvature of the lumbar spine. 2. Degenerative disc disease at L5-S1 with mild lower lumbar facet hypertrophy.   Electronically Signed By: Keith Rake M.D.  EXAM: BILATERAL SACROILIAC JOINTS - 3+ VIEW 07/25/19  COMPARISON: None.  FINDINGS: The  sacroiliac joint spaces are maintained. No osseous erosions. Minimal inferior spurring is degenerative. The sacral ala are maintained. No other bone abnormalities are seen.  IMPRESSION: Mild degenerative change of the sacroiliac joints. No evidence of inflammatory arthropathy.   Electronically Signed By: Keith Rake M.D. On: 07/25/2019 16:55  I, Lynne Leader, personally (independently) visualized and performed the interpretation of the images attached in this note.    Impression and Recommendations:    Assessment and Plan: 69 y.o. female with lumbosacral strain.  Symptoms ongoing for about a week now.  Overall patient doing reasonably well.  Plan for physical therapy heating pad TENS unit home exercise program.  Continue cyclobenzaprine as prescribed by PCP.  Recheck back with me 4 to 6 weeks if not better.  Return sooner if needed.  Precautions reviewed.Marland Kitchen  PDMP not reviewed this encounter. Orders Placed This Encounter  Procedures  . Ambulatory referral to Physical Therapy    Referral Priority:   Routine    Referral Type:   Physical Medicine    Referral Reason:   Specialty Services Required    Requested Specialty:   Physical Therapy   No orders of the defined types were placed in this encounter.   Discussed warning signs or symptoms. Please see discharge instructions. Patient expresses understanding.  Will send note to PCP Dr [facility] [address] [place], [place] [address] Phone: [phone] Fax: [phone]   The above documentation has been reviewed and is accurate and complete Lynne Leader

## 2019-08-01 NOTE — Patient Instructions (Signed)
Thank you for coming in today. Attend PT.  Use Heat and TENS unit.  Back brace is ok.  Advance activity as tolerated.  Recheck with me in 4-6 weeks if not better.  Return or contact me sooner if not doing well.   TENS UNIT: This is helpful for muscle pain and spasm.   Search and Purchase a TENS 7000 2nd edition at  www.tenspros.com or www.[email or web address] It should be less than $30.     TENS unit instructions: Do not shower or bathe with the unit on Turn the unit off before removing electrodes or batteries If the electrodes lose stickiness add a drop of water to the electrodes after they are disconnected from the unit and place on plastic sheet. If you continued to have difficulty, call the TENS unit company to purchase more electrodes. Do not apply lotion on the skin area prior to use. Make sure the skin is clean and dry as this will help prolong the life of the electrodes. After use, always check skin for unusual red areas, rash or other skin difficulties. If there are any skin problems, does not apply electrodes to the same area. Never remove the electrodes from the unit by pulling the wires. Do not use the TENS unit or electrodes other than as directed. Do not change electrode placement without consultating your therapist or physician. Keep 2 fingers with between each electrode. Wear time ratio is 2:1, on to off times.    For example on for 30 minutes off for 15 minutes and then on for 30 minutes off for 15 minutes      Lumbosacral Strain Lumbosacral strain is an injury that causes pain in the lower back (lumbosacral spine). This injury usually happens from overstretching the muscles or ligaments along your spine. Ligaments are cord-like tissues that connect bones to other bones. A strain can affect one or more muscles or ligaments. What are the causes? This condition may be caused by:  A hard, direct hit to the back.  Overstretching the lower back muscles. This may result  from: ? A fall. ? Lifting something heavy. ? Repetitive movements such as bending or crouching. What increases the risk? The following factors may make you more likely to develop this condition:  Participating in sports or activities that involve: ? A sudden twist of the back. ? Pushing or pulling motions.  Being overweight or obese.  Having poor strength and flexibility, especially tight hamstrings or weak muscles in the back or abdomen.  Having too much of a curve in the lower back.  Having a pelvis that is tilted forward. What are the signs or symptoms? The main symptom of this condition is pain in the lower back, at the site of the strain. Pain may also be felt down one or both legs. How is this diagnosed? This condition is diagnosed based on your symptoms, your medical history, and a physical exam. During the physical exam, your health care provider may push on certain areas of your back to find the source of your pain. You may be asked to bend forward, backward, and side to side to check your pain and range of motion. You may also have imaging tests, such as X-rays and an MRI. How is this treated? This condition may be treated by:  Applying heat and cold on the affected area.  Taking medicines to help relieve pain and relax your muscles.  Taking NSAIDs, such as ibuprofen, to help reduce swelling and discomfort.  Doing stretching  and strengthening exercises for your lower back. Symptoms usually improve within several weeks of treatment. However, recovery time varies. When your symptoms improve, gradually return to your normal routine as soon as possible to reduce pain, avoid stiffness, and keep muscle strength. Follow these instructions at home: Medicines  Take over-the-counter and prescription medicines only as told by your health care provider.  Ask your health care provider if the medicine prescribed to you: ? Requires you to avoid driving or using heavy machinery. ?  Can cause constipation. You may need to take these actions to prevent or treat constipation:  Drink enough fluid to keep your urine pale yellow.  Take over-the-counter or prescription medicines.  Eat foods that are high in fiber, such as beans, whole grains, and fresh fruits and vegetables.  Limit foods that are high in fat and processed sugars, such as fried or sweet foods. Managing pain, stiffness, and swelling      If directed, put ice on the injured area. To do this: ? Put ice in a plastic bag. ? Place a towel between your skin and the bag. ? Leave the ice on for 20 minutes, 2-3 times a day.  If directed, apply heat on the affected area as often as told by your health care provider. Use the heat source that your health care provider recommends, such as a moist heat pack or a heating pad. ? Place a towel between your skin and the heat source. ? Leave the heat on for 20-30 minutes. ? Remove the heat if your skin turns bright red. This is especially important if you are unable to feel pain, heat, or cold. You may have a greater risk of getting burned. Activity  Rest as told by your health care provider.  Do not stay in bed. Staying in bed for more than 1-2 days can delay your recovery.  Return to your normal activities as told by your health care provider. Ask your health care provider what activities are safe for you.  Avoid activities that take a lot of energy for as long as told by your health care provider.  Do exercises as told by your health care provider. This includes stretching and strengthening exercises. General instructions  Sit up and stand up straight. Avoid leaning forward when you sit, or hunching over when you stand.  Do not use any products that contain nicotine or tobacco, such as cigarettes, e-cigarettes, and chewing tobacco. If you need help quitting, ask your health care provider.  Keep all follow-up visits as told by your health care provider. This is  important. How is this prevented?   Use correct form when playing sports and lifting heavy objects.  Use good posture when sitting and standing.  Maintain a healthy weight.  Sleep on a mattress with medium firmness to support your back.  Do at least 150 minutes of moderate-intensity exercise each week, such as brisk walking or water aerobics. Try a form of exercise that takes stress off your back, such as swimming or stationary cycling.  Maintain physical fitness, including: ? Strength. ? Flexibility. Contact a health care provider if:  Your back pain does not improve after several weeks of treatment.  Your symptoms get worse. Get help right away if:  Your back pain is severe.  You cannot stand or walk.  You have difficulty controlling when you urinate or when you have a bowel movement.  You feel nauseous or you vomit.  Your feet or legs get very cold, turn  pale, or look blue.  You have numbness, tingling, weakness, or problems using your arms or legs.  You develop any of the following: ? Shortness of breath. ? Dizziness. ? Pain in your legs. ? Weakness in your buttocks or legs. Summary  Lumbosacral strain is an injury that causes pain in the lower back (lumbosacral spine).  This injury usually happens from overstretching the muscles or ligaments along your spine.  This condition may be caused by a direct hit to the lower back or by overstretching the lower back muscles.  Symptoms usually improve within several weeks of treatment. This information is not intended to replace advice given to you by your health care provider. Make sure you discuss any questions you have with your health care provider. Document Revised: 09/17/2018 Document Reviewed: 09/17/2018 Elsevier Patient Education  [place].

## 2019-08-26 DIAGNOSIS — R69 Illness, unspecified: Secondary | ICD-10-CM | POA: Diagnosis not present

## 2019-12-14 IMAGING — MG DIGITAL SCREENING BILATERAL MAMMOGRAM WITH TOMO AND CAD
8 series · 8 of 24 positions shown · non-contrast
Comparison: Previous exam(s).

CLINICAL DATA: Screening.

EXAM:
DIGITAL SCREENING BILATERAL MAMMOGRAM WITH TOMO AND CAD

[L CC synth-2D]
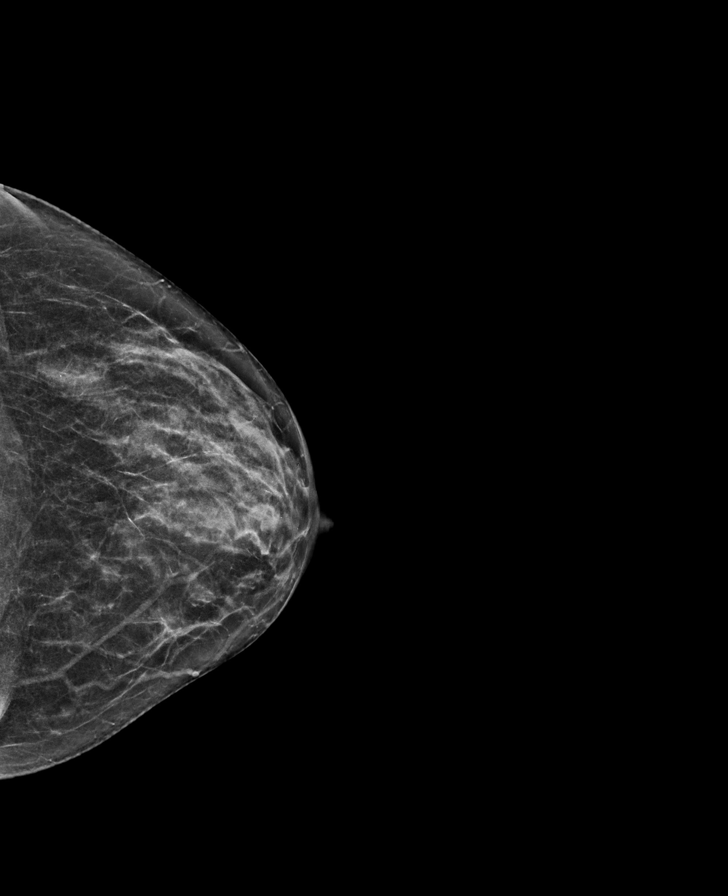

[R CC synth-2D]
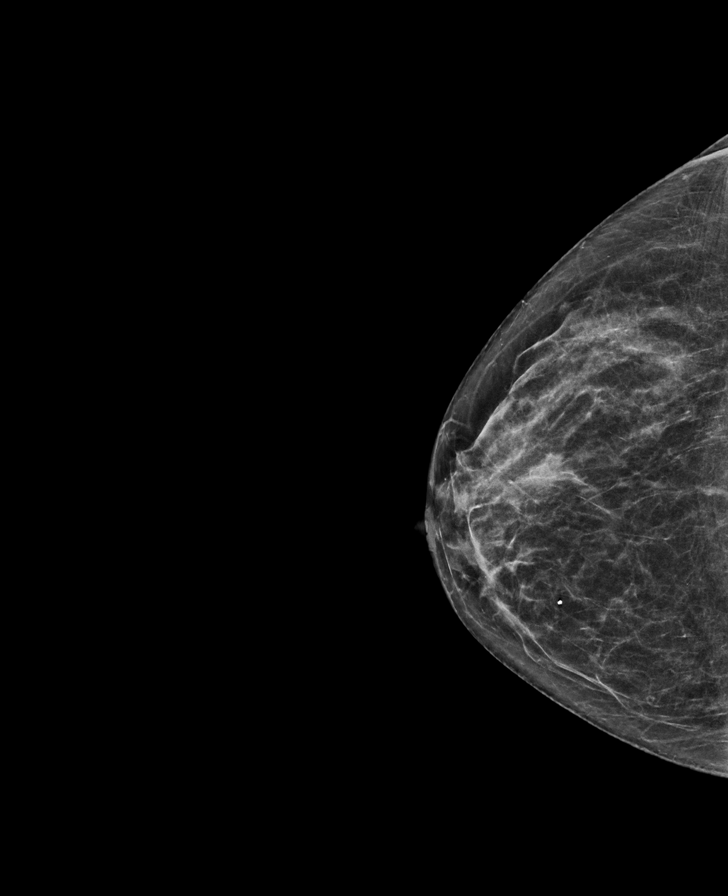

[R MLO synth-2D]
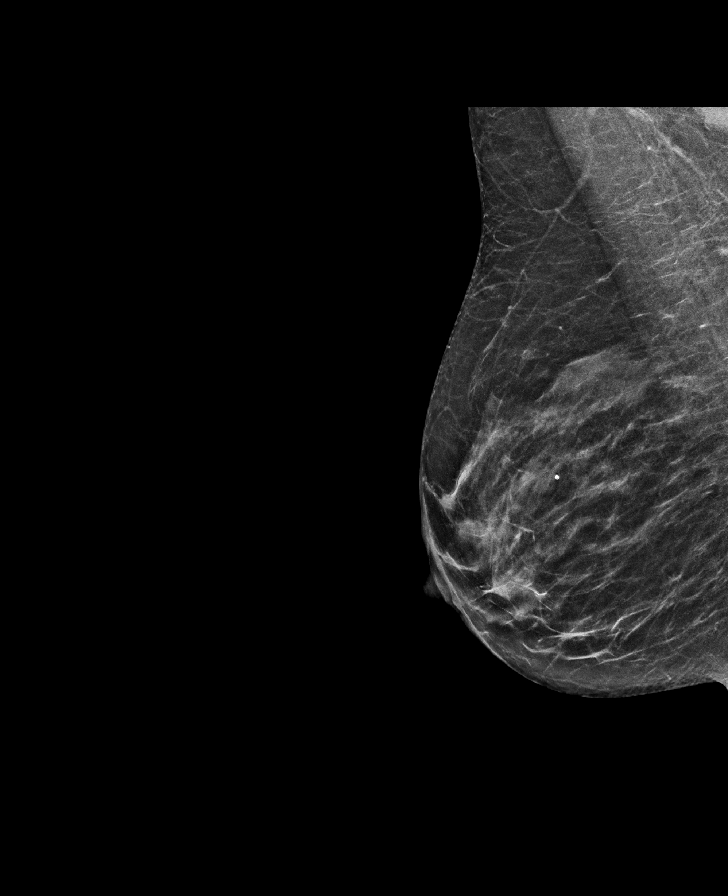

[L MLO synth-2D]
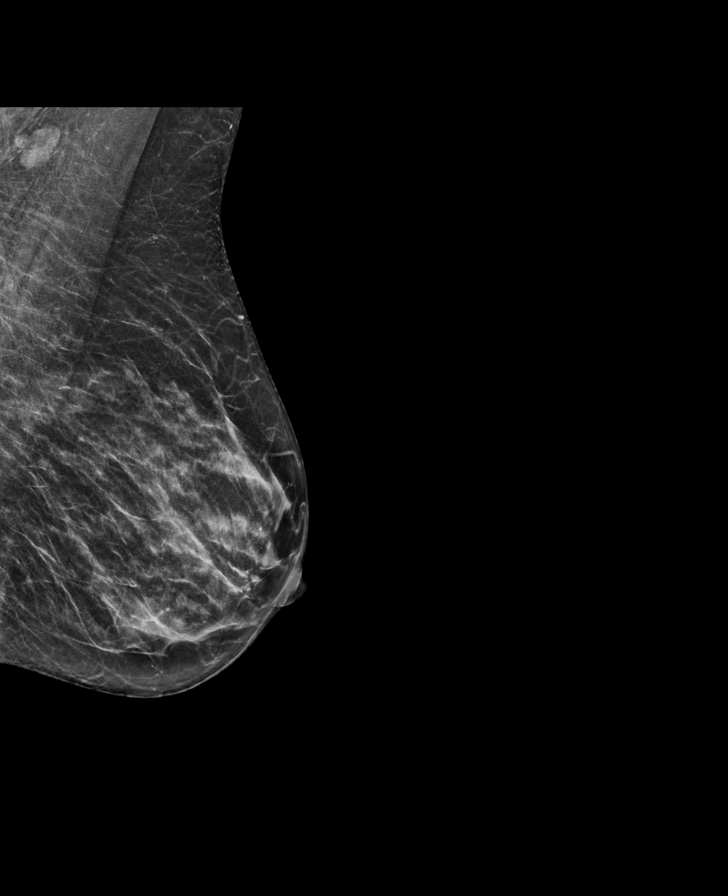

[L CC tomo · tomo slice 29/57.0]
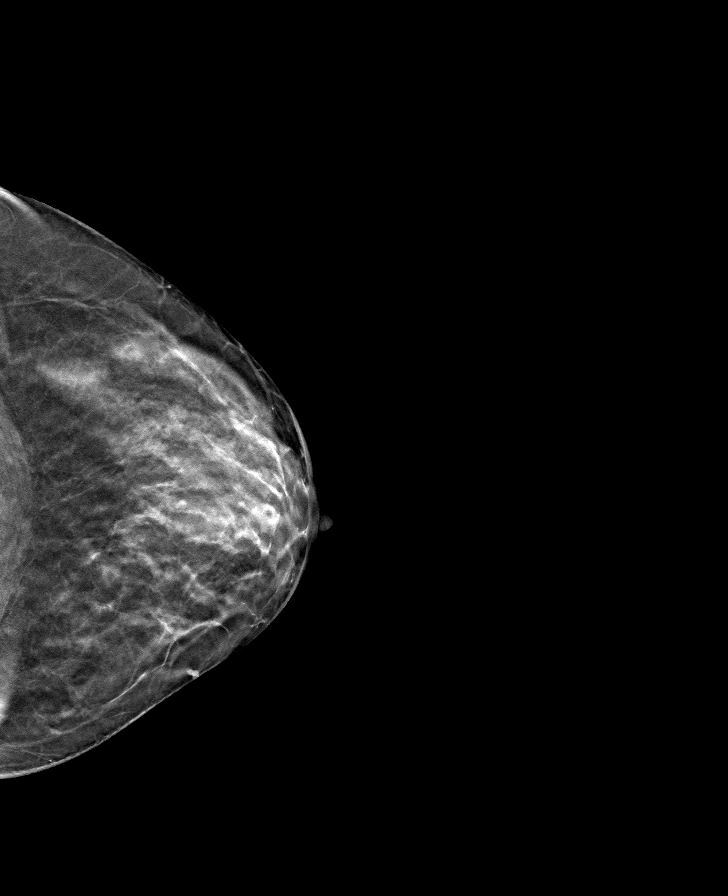

[R CC tomo · tomo slice 29/57.0]
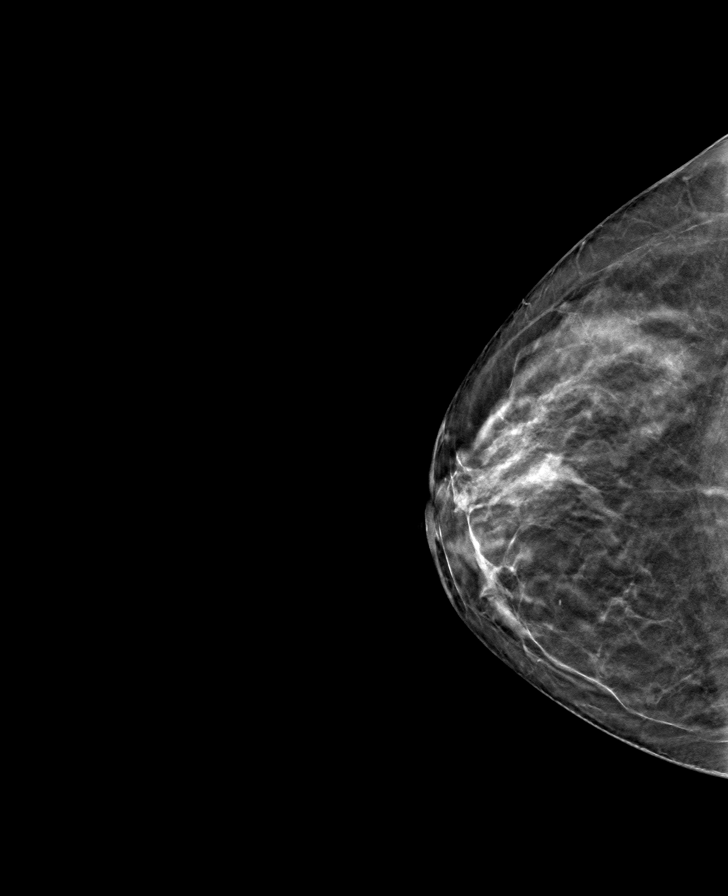

[R MLO tomo · tomo slice 29/58.0]
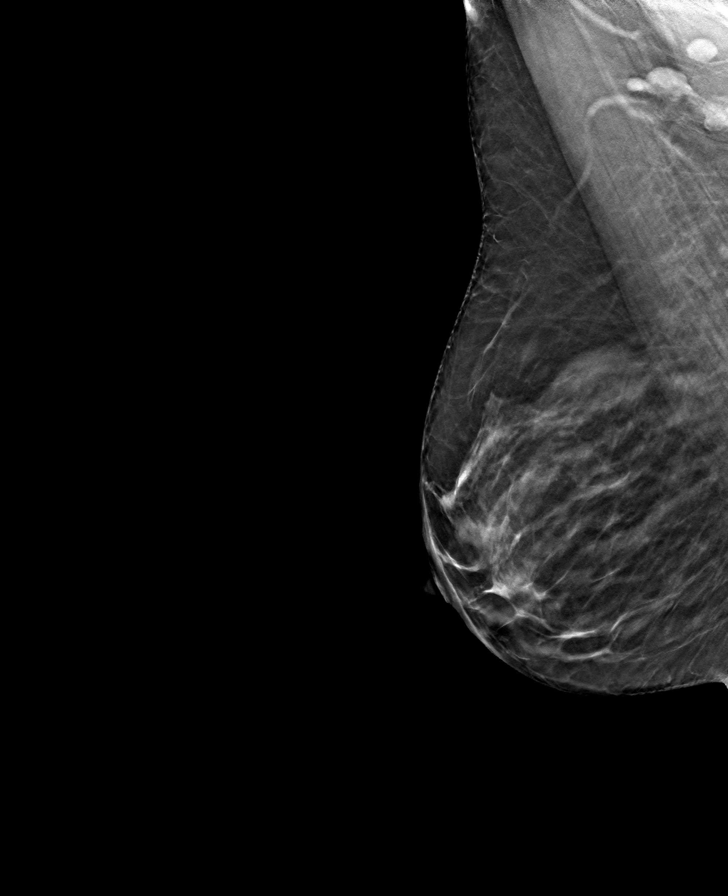

[L MLO tomo · tomo slice 29/58.0]
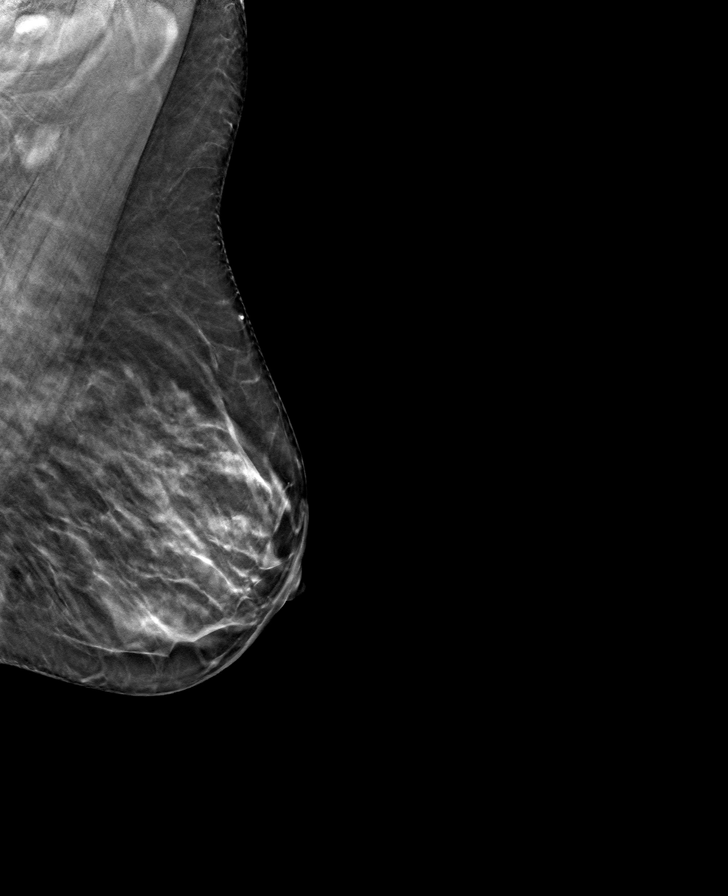

[8 of 24 positions shown; findings below may reference images not displayed]

ACR Breast Density Category c: The breast tissue is heterogeneously
dense, which may obscure small masses.
FINDINGS: In the left breast, a possible asymmetry warrants further
evaluation. In the right breast, no findings suspicious for
malignancy. Images were processed with CAD.
IMPRESSION: Further evaluation is suggested for possible asymmetry in the left
breast.

RECOMMENDATION:
Diagnostic mammogram and possibly ultrasound of the left breast.
(Code:[id number])

The patient will be contacted regarding the findings, and additional
imaging will be scheduled.

BI-RADS CATEGORY  0: Incomplete. Need additional imaging evaluation
and/or prior mammograms for comparison.

## 2019-12-21 IMAGING — US US AXILLARY LEFT
1 series · 4 of 4 positions shown · non-contrast
Comparison: Screening mammogram February 11, 2018

CLINICAL DATA: 67-year-old patient recalled from recent screening
mammogram for evaluation of possible asymmetry in the left axillary
region.

EXAM:
ULTRASOUND OF THE LEFT AXILLA

[Series 1: us axillary left · 0.07mm/px · 4 of 4 slices shown]
[im 1/4]
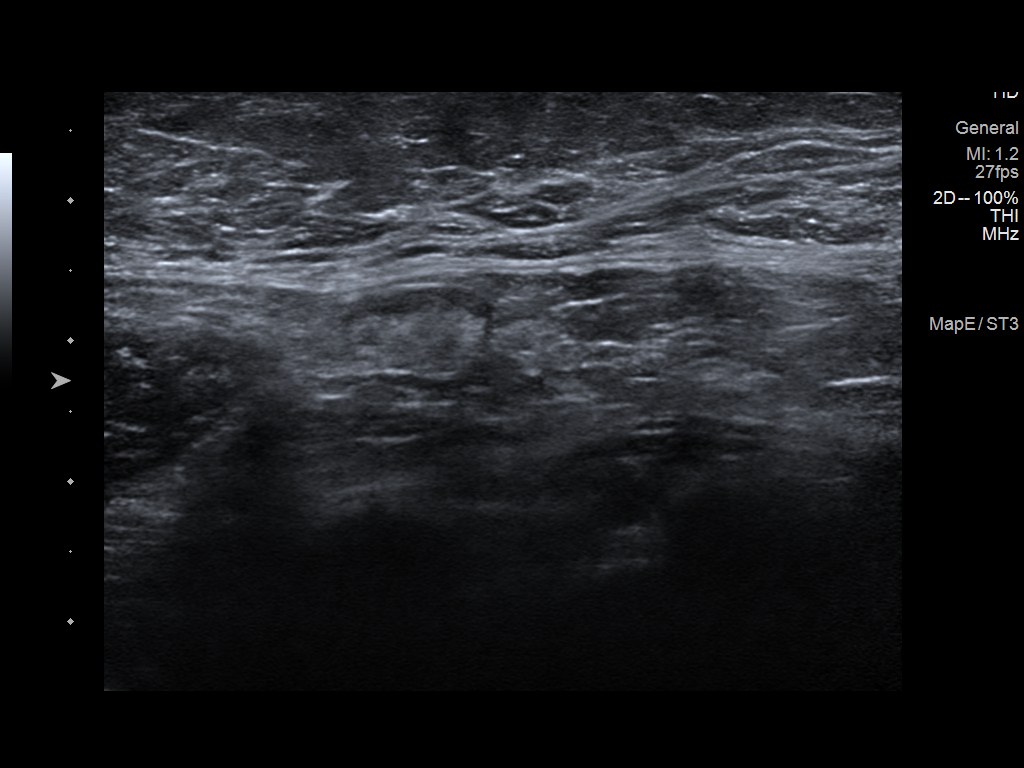
[im 2/4]
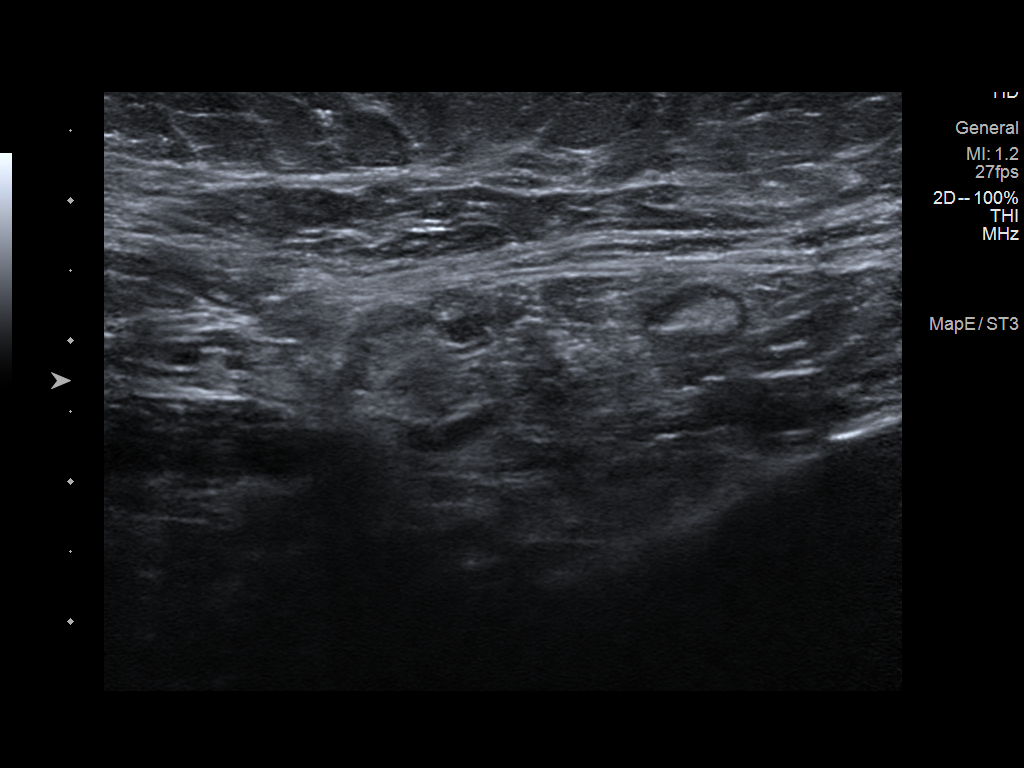
[im 3/4]
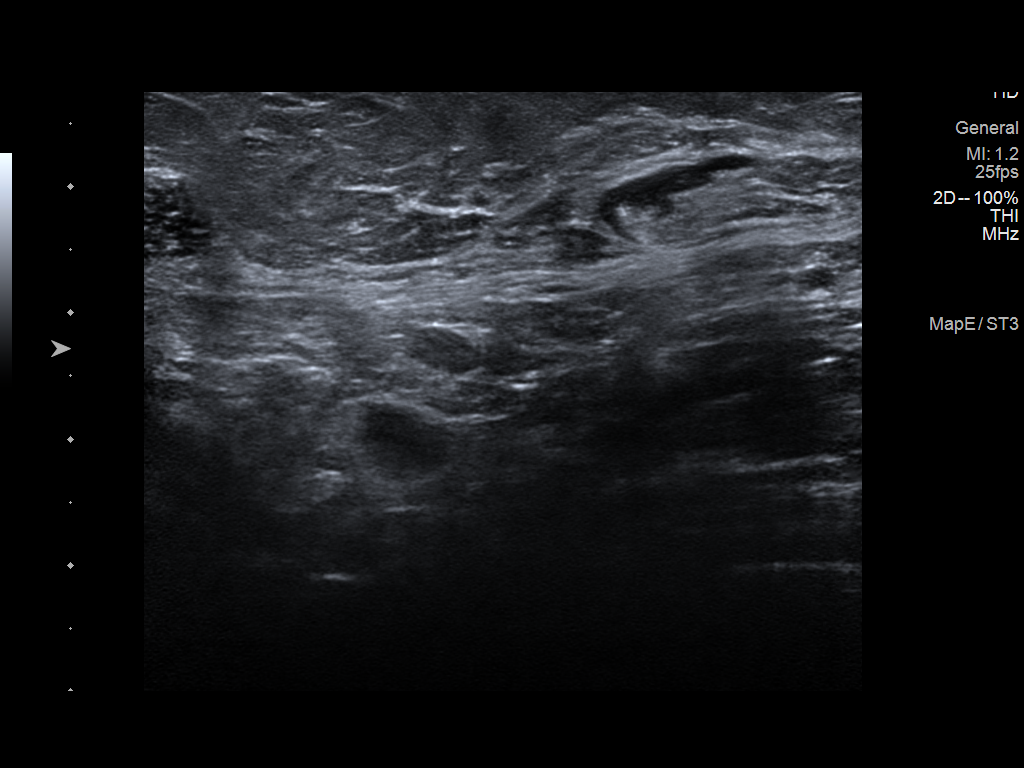
[im 4/4]
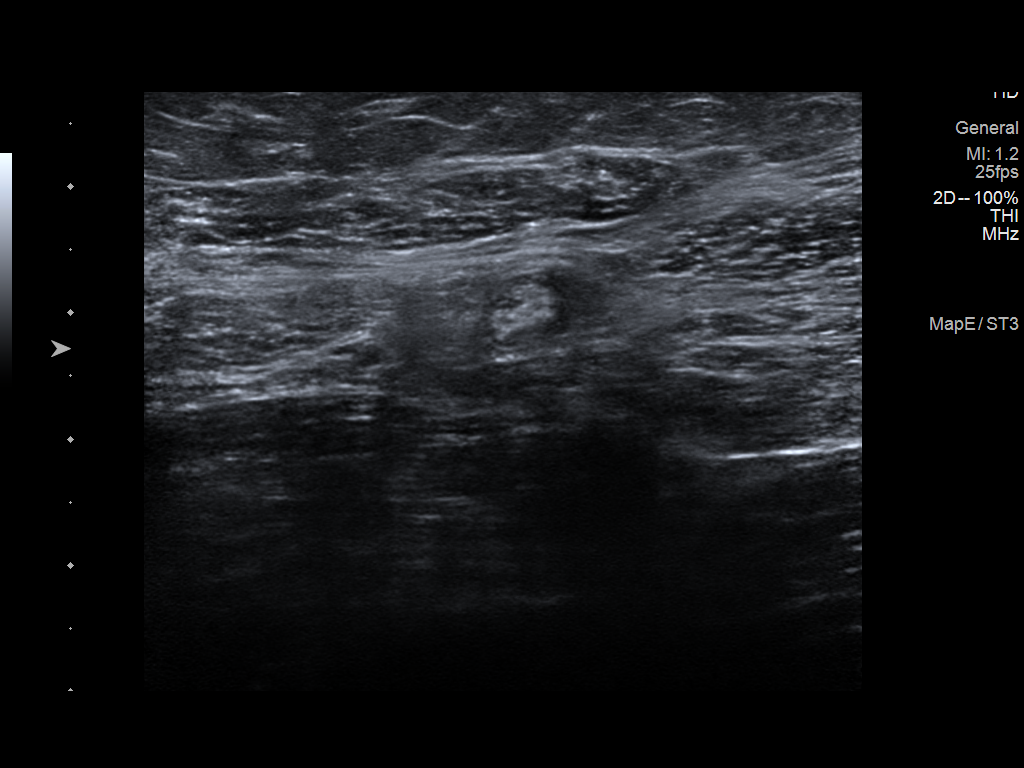

[4 of 4 positions shown; findings below may reference images not displayed]

FINDINGS: On physical exam,no mass is palpated in the left axilla

Ultrasound is performed, showing normal left axillary lymph nodes
and muscle. Normal subcutaneous tissues. No lymphadenopathy or mass
lesion.
IMPRESSION: Left axillary ultrasound is within normal limits. Negative for
lymphadenopathy or mass. Findings on recent screening mammogram are
most consistent with overlap of muscle and lymph nodes.

RECOMMENDATION:
Bilateral screening mammogram is recommended in February 2019.

I have discussed the findings and recommendations with the patient.
Results were also provided in writing at the conclusion of the
visit. If applicable, a reminder letter will be sent to the patient
regarding the next appointment.

BI-RADS CATEGORY  1: Negative.

## 2020-02-03 DIAGNOSIS — R69 Illness, unspecified: Secondary | ICD-10-CM | POA: Diagnosis not present

## 2020-03-10 ENCOUNTER — Other Ambulatory Visit: Payer: Self-pay | Admitting: Internal Medicine

## 2020-03-10 DIAGNOSIS — R413 Other amnesia: Secondary | ICD-10-CM

## 2020-03-24 ENCOUNTER — Other Ambulatory Visit: Payer: Self-pay | Admitting: Internal Medicine

## 2020-03-25 ENCOUNTER — Ambulatory Visit
Admission: RE | Admit: 2020-03-25 | Discharge: 2020-03-25 | Disposition: A | Discharge status: Home / Self Care | Payer: Medicare HMO | Source: Ambulatory Visit | Attending: Internal Medicine | Admitting: Internal Medicine

## 2020-03-25 ENCOUNTER — Other Ambulatory Visit: Payer: Self-pay

## 2020-03-25 DIAGNOSIS — R413 Other amnesia: Secondary | ICD-10-CM

## 2020-07-01 DIAGNOSIS — M7989 Other specified soft tissue disorders: Secondary | ICD-10-CM | POA: Diagnosis not present

## 2020-07-01 DIAGNOSIS — W19XXXA Unspecified fall, initial encounter: Secondary | ICD-10-CM | POA: Diagnosis not present

## 2020-07-01 DIAGNOSIS — S52502A Unspecified fracture of the lower end of left radius, initial encounter for closed fracture: Secondary | ICD-10-CM | POA: Insufficient documentation

## 2020-07-01 DIAGNOSIS — S52532A Colles' fracture of left radius, initial encounter for closed fracture: Secondary | ICD-10-CM | POA: Diagnosis not present

## 2020-07-01 DIAGNOSIS — S62102A Fracture of unspecified carpal bone, left wrist, initial encounter for closed fracture: Secondary | ICD-10-CM | POA: Diagnosis not present

## 2020-07-05 DIAGNOSIS — X58XXXA Exposure to other specified factors, initial encounter: Secondary | ICD-10-CM | POA: Diagnosis not present

## 2020-07-05 DIAGNOSIS — G8918 Other acute postprocedural pain: Secondary | ICD-10-CM | POA: Diagnosis not present

## 2020-07-05 DIAGNOSIS — Y999 Unspecified external cause status: Secondary | ICD-10-CM | POA: Diagnosis not present

## 2020-07-05 DIAGNOSIS — S52532A Colles' fracture of left radius, initial encounter for closed fracture: Secondary | ICD-10-CM | POA: Diagnosis not present

## 2020-07-05 DIAGNOSIS — S52572A Other intraarticular fracture of lower end of left radius, initial encounter for closed fracture: Secondary | ICD-10-CM | POA: Diagnosis not present

## 2020-07-20 DIAGNOSIS — S52532D Colles' fracture of left radius, subsequent encounter for closed fracture with routine healing: Secondary | ICD-10-CM | POA: Diagnosis not present

## 2020-07-20 DIAGNOSIS — Z4789 Encounter for other orthopedic aftercare: Secondary | ICD-10-CM | POA: Diagnosis not present

## 2020-08-03 DIAGNOSIS — S52532D Colles' fracture of left radius, subsequent encounter for closed fracture with routine healing: Secondary | ICD-10-CM | POA: Diagnosis not present

## 2020-08-03 DIAGNOSIS — Z4789 Encounter for other orthopedic aftercare: Secondary | ICD-10-CM | POA: Diagnosis not present

## 2020-08-03 DIAGNOSIS — M25632 Stiffness of left wrist, not elsewhere classified: Secondary | ICD-10-CM | POA: Diagnosis not present

## 2020-08-05 DIAGNOSIS — Z0100 Encounter for examination of eyes and vision without abnormal findings: Secondary | ICD-10-CM | POA: Diagnosis not present

## 2020-08-05 DIAGNOSIS — H5203 Hypermetropia, bilateral: Secondary | ICD-10-CM | POA: Diagnosis not present

## 2020-08-11 DIAGNOSIS — M25632 Stiffness of left wrist, not elsewhere classified: Secondary | ICD-10-CM | POA: Diagnosis not present

## 2020-08-20 DIAGNOSIS — M25632 Stiffness of left wrist, not elsewhere classified: Secondary | ICD-10-CM | POA: Diagnosis not present

## 2020-09-07 DIAGNOSIS — M25632 Stiffness of left wrist, not elsewhere classified: Secondary | ICD-10-CM | POA: Diagnosis not present

## 2020-09-13 DIAGNOSIS — M25632 Stiffness of left wrist, not elsewhere classified: Secondary | ICD-10-CM | POA: Diagnosis not present

## 2020-09-14 DIAGNOSIS — Z4789 Encounter for other orthopedic aftercare: Secondary | ICD-10-CM | POA: Diagnosis not present

## 2020-09-14 DIAGNOSIS — S52532D Colles' fracture of left radius, subsequent encounter for closed fracture with routine healing: Secondary | ICD-10-CM | POA: Diagnosis not present

## 2020-12-07 DIAGNOSIS — Z0000 Encounter for general adult medical examination without abnormal findings: Secondary | ICD-10-CM | POA: Diagnosis not present

## 2020-12-07 DIAGNOSIS — E7800 Pure hypercholesterolemia, unspecified: Secondary | ICD-10-CM | POA: Diagnosis not present

## 2020-12-07 DIAGNOSIS — E875 Hyperkalemia: Secondary | ICD-10-CM | POA: Diagnosis not present

## 2020-12-14 DIAGNOSIS — E7800 Pure hypercholesterolemia, unspecified: Secondary | ICD-10-CM | POA: Diagnosis not present

## 2020-12-14 DIAGNOSIS — R030 Elevated blood-pressure reading, without diagnosis of hypertension: Secondary | ICD-10-CM | POA: Diagnosis not present

## 2020-12-14 DIAGNOSIS — Z0000 Encounter for general adult medical examination without abnormal findings: Secondary | ICD-10-CM | POA: Diagnosis not present

## 2020-12-29 DIAGNOSIS — R198 Other specified symptoms and signs involving the digestive system and abdomen: Secondary | ICD-10-CM | POA: Diagnosis not present

## 2020-12-29 DIAGNOSIS — R197 Diarrhea, unspecified: Secondary | ICD-10-CM | POA: Diagnosis not present

## 2021-02-21 ENCOUNTER — Other Ambulatory Visit: Payer: Self-pay

## 2021-02-21 ENCOUNTER — Other Ambulatory Visit: Payer: Self-pay | Admitting: Internal Medicine

## 2021-02-21 DIAGNOSIS — Z1231 Encounter for screening mammogram for malignant neoplasm of breast: Secondary | ICD-10-CM

## 2021-03-25 ENCOUNTER — Ambulatory Visit
Admission: RE | Admit: 2021-03-25 | Discharge: 2021-03-25 | Disposition: A | Discharge status: Home / Self Care | Payer: Medicare HMO | Source: Ambulatory Visit

## 2021-03-25 ENCOUNTER — Other Ambulatory Visit: Payer: Self-pay

## 2021-03-25 DIAGNOSIS — Z1231 Encounter for screening mammogram for malignant neoplasm of breast: Secondary | ICD-10-CM | POA: Diagnosis not present

## 2021-12-14 DIAGNOSIS — E7800 Pure hypercholesterolemia, unspecified: Secondary | ICD-10-CM | POA: Diagnosis not present

## 2021-12-14 DIAGNOSIS — Z833 Family history of diabetes mellitus: Secondary | ICD-10-CM | POA: Diagnosis not present

## 2021-12-14 DIAGNOSIS — E875 Hyperkalemia: Secondary | ICD-10-CM | POA: Diagnosis not present

## 2021-12-21 DIAGNOSIS — Z23 Encounter for immunization: Secondary | ICD-10-CM | POA: Diagnosis not present

## 2021-12-21 DIAGNOSIS — E7800 Pure hypercholesterolemia, unspecified: Secondary | ICD-10-CM | POA: Diagnosis not present

## 2021-12-21 DIAGNOSIS — R413 Other amnesia: Secondary | ICD-10-CM | POA: Diagnosis not present

## 2021-12-21 DIAGNOSIS — Z0000 Encounter for general adult medical examination without abnormal findings: Secondary | ICD-10-CM | POA: Diagnosis not present

## 2022-01-12 DIAGNOSIS — R109 Unspecified abdominal pain: Secondary | ICD-10-CM | POA: Diagnosis not present

## 2022-01-12 DIAGNOSIS — R195 Other fecal abnormalities: Secondary | ICD-10-CM | POA: Diagnosis not present

## 2022-01-12 DIAGNOSIS — R1912 Hyperactive bowel sounds: Secondary | ICD-10-CM | POA: Diagnosis not present

## 2022-01-16 DIAGNOSIS — R195 Other fecal abnormalities: Secondary | ICD-10-CM | POA: Diagnosis not present

## 2022-01-18 ENCOUNTER — Other Ambulatory Visit: Payer: Self-pay

## 2022-01-18 ENCOUNTER — Other Ambulatory Visit (HOSPITAL_BASED_OUTPATIENT_CLINIC_OR_DEPARTMENT_OTHER): Payer: Self-pay

## 2022-01-18 DIAGNOSIS — R109 Unspecified abdominal pain: Secondary | ICD-10-CM

## 2022-01-18 DIAGNOSIS — R1912 Hyperactive bowel sounds: Secondary | ICD-10-CM

## 2022-01-18 DIAGNOSIS — R195 Other fecal abnormalities: Secondary | ICD-10-CM

## 2022-01-31 ENCOUNTER — Encounter (HOSPITAL_BASED_OUTPATIENT_CLINIC_OR_DEPARTMENT_OTHER): Payer: Self-pay

## 2022-01-31 ENCOUNTER — Ambulatory Visit (HOSPITAL_BASED_OUTPATIENT_CLINIC_OR_DEPARTMENT_OTHER)
Admission: RE | Admit: 2022-01-31 | Discharge: 2022-01-31 | Disposition: A | Discharge status: Home / Self Care | Payer: Medicare HMO | Source: Ambulatory Visit

## 2022-01-31 DIAGNOSIS — K769 Liver disease, unspecified: Secondary | ICD-10-CM | POA: Diagnosis not present

## 2022-01-31 DIAGNOSIS — R1912 Hyperactive bowel sounds: Secondary | ICD-10-CM | POA: Insufficient documentation

## 2022-01-31 DIAGNOSIS — R1915 Other abnormal bowel sounds: Secondary | ICD-10-CM | POA: Diagnosis not present

## 2022-01-31 DIAGNOSIS — R195 Other fecal abnormalities: Secondary | ICD-10-CM | POA: Diagnosis not present

## 2022-01-31 DIAGNOSIS — R109 Unspecified abdominal pain: Secondary | ICD-10-CM | POA: Insufficient documentation

## 2022-01-31 LAB — POCT I-STAT CREATININE: Creatinine, Ser: 0.8 mg/dL (ref 0.44–1.00)

## 2022-01-31 MED ORDER — IOHEXOL 300 MG/ML  SOLN
100.0000 mL | Freq: Once | INTRAMUSCULAR | Status: AC | PRN
  Administered 2022-01-31: 80 mL via INTRAVENOUS

## 2022-03-13 ENCOUNTER — Other Ambulatory Visit: Payer: Self-pay | Admitting: Internal Medicine

## 2022-03-13 DIAGNOSIS — Z1231 Encounter for screening mammogram for malignant neoplasm of breast: Secondary | ICD-10-CM

## 2022-05-11 ENCOUNTER — Ambulatory Visit
Admission: RE | Admit: 2022-05-11 | Discharge: 2022-05-11 | Disposition: A | Discharge status: Home / Self Care | Payer: Medicare HMO | Source: Ambulatory Visit | Attending: Internal Medicine | Admitting: Internal Medicine

## 2022-05-11 DIAGNOSIS — Z1231 Encounter for screening mammogram for malignant neoplasm of breast: Secondary | ICD-10-CM

## 2022-05-17 DIAGNOSIS — G3184 Mild cognitive impairment, so stated: Secondary | ICD-10-CM | POA: Diagnosis not present

## 2022-07-04 DIAGNOSIS — G3184 Mild cognitive impairment, so stated: Secondary | ICD-10-CM | POA: Insufficient documentation

## 2022-07-05 ENCOUNTER — Telehealth: Payer: Self-pay | Admitting: Psychiatry

## 2022-07-05 ENCOUNTER — Ambulatory Visit: Payer: Medicare HMO | Admitting: Psychiatry

## 2022-07-05 ENCOUNTER — Encounter: Payer: Self-pay | Admitting: Psychiatry

## 2022-07-05 VITALS — BP 143/83 | HR 65 | Ht 61.0 in | Wt 125.5 lb

## 2022-07-05 DIAGNOSIS — R413 Other amnesia: Secondary | ICD-10-CM | POA: Diagnosis not present

## 2022-07-05 NOTE — Progress Notes (Signed)
[facility]  PATIENT: Brandy Ford DOB: 1951/01/11  REFERRING CLINICIAN: Deon Pilling, NP HISTORY FROM: self, husband REASON FOR VISIT: memory loss   HISTORICAL  CHIEF COMPLAINT:  Chief Complaint  Patient presents with   Room 11    Pt is here with her Husband. Pt husband states that pt's memory started decreasing about 1 year ago. Pt states that her short term memory is the one that is decreasing more. Pt's husband states that when she is anxious or nervous or pushed her memory starts being affected.     HISTORY OF PRESENT ILLNESS:  The patient presents for evaluation of memory loss which has been present over the past 1.5 years. She does feel like her memory has been improving over time. Has good days and bad days. Feels her memory issues are mostly associated with anxiety and stress. Gets confused when she is pressured to do things quickly. Family members have told her that her she repeats herself. Has not noticed any difficulty with her ADLs. Does not drive or handle finances, which is not new for her. She is managing her own medications and cooking without issues. She does have some stress in her life as she takes care of her special needs granddaughter. States she sometimes just "wants to get away", and is planning a beach trip soon.   She has been taking donepezil for the past 3 months and recently increased the dose to 10 mg daily. She does feel this has helped her memory.  She had a [facility] 03/2020 which showed generalized cerebral volume loss and mild chronic microvascular disease.  Her older sister has recently been having some memory issues. Otherwise no known family history of dementia.  TBI:  No past history of TBI Stroke:  no past history of stroke Seizures:  no past history of seizures Sleep: no history of sleep apnea.  Sleeps well at night. Mood: She denies depression. Does note that she sometimes feels her "son doesn't seem to care about her",  which makes her feel sad. Memory seems worse when she is anxious or overwhelmed. Could not complete memory testing with her PCP due to anxiety and distress.  Functional status:  Patient lives with her husband Cooking: no issues Cleaning: no issues Shopping: goes shopping with her husband Driving: she doesn't drive, hasn't driven for a long time Bills: husband has always handled the bills Medications: handles her medications without issues, uses a pillbox Ever left the stove on by accident?: no issues Getting lost going to familiar places?: no Forgetting loved ones names?: no Word finding difficulty? sometimes   OTHER MEDICAL CONDITIONS: HLD, HTN   REVIEW OF SYSTEMS: Full 14 system review of systems performed and negative with exception of: memory loss  ALLERGIES: Allergies  Allergen Reactions   Pravastatin Sodium     Headaches and extreme muscle cramps    HOME MEDICATIONS: Outpatient Medications Prior to Visit  Medication Sig Dispense Refill   Apoaequorin (PREVAGEN PO) Take by mouth.     aspirin EC 81 MG tablet Take 81 mg by mouth daily.     donepezil (ARICEPT) 10 MG tablet Take 10 mg by mouth at bedtime.     ezetimibe (ZETIA) 10 MG tablet take 1 tablet by mouth once daily 30 tablet 5   Multiple Vitamin (MULTIVITAMIN WITH MINERALS) TABS tablet Take 1 tablet by mouth daily.     amLODipine (NORVASC) 5 MG tablet Take 1 tablet (5 mg total) by mouth daily. (Patient not taking:  Reported on 07/05/2022) 30 tablet 0   No facility-administered medications prior to visit.    PAST MEDICAL HISTORY: Past Medical History:  Diagnosis Date   High blood cholesterol    Hypertension     PAST SURGICAL HISTORY: Past Surgical History:  Procedure Laterality Date   CESAREAN SECTION      FAMILY HISTORY: History reviewed. No pertinent family history.  SOCIAL HISTORY: Social History   Socioeconomic History   Marital status: Divorced    Spouse name: Not on file   Number of children:  Not on file   Years of education: Not on file   Highest education level: Not on file  Occupational History   Not on file  Tobacco Use   Smoking status: Never   Smokeless tobacco: Not on file  Substance and Sexual Activity   Alcohol use: Yes   Drug use: No   Sexual activity: Not on file  Other Topics Concern   Not on file  Social History Narrative   Not on file   Social Determinants of Health   Financial Resource Strain: Not on file  Food Insecurity: Not on file  Transportation Needs: Not on file  Physical Activity: Not on file  Stress: Not on file  Social Connections: Not on file  Intimate Partner Violence: Not on file     PHYSICAL EXAM  GENERAL EXAM/CONSTITUTIONAL: Vitals:  Vitals:   07/05/22 0905  BP: (!) 143/83  Pulse: 65  Weight: 125 lb 8 oz (56.9 kg)  Height: '5\' 1"'$  (1.549 m)   Body mass index is 23.71 kg/m. Wt Readings from Last 3 Encounters:  07/05/22 125 lb 8 oz (56.9 kg)  08/01/19 137 lb (62.1 kg)  02/25/18 118 lb (53.5 kg)    NEUROLOGIC: MENTAL STATUS:     07/05/2022    9:09 AM  Montreal Cognitive Assessment   Visuospatial/ Executive (0/5) 4  Naming (0/3) 3  Attention: Read list of digits (0/2) 1  Attention: Read list of letters (0/1) 1  Attention: Serial 7 subtraction starting at 100 (0/3) 3  Language: Repeat phrase (0/2) 0  Language : Fluency (0/1) 1  Abstraction (0/2) 2  Delayed Recall (0/5) 3  Orientation (0/6) 6  Total 24  Adjusted Score (based on education) 25     CRANIAL NERVE:  2nd, 3rd, 4th, 6th - pupils equal and reactive to light, visual fields full to confrontation, extraocular muscles intact, no nystagmus 5th - facial sensation symmetric 7th - facial strength symmetric 8th - hearing intact 9th - palate elevates symmetrically, uvula midline 11th - shoulder shrug symmetric 12th - tongue protrusion midline  MOTOR:  normal bulk and tone, no cogwheeling, full strength in the BUE, BLE  SENSORY:  normal and symmetric to  light touch all 4 extremities  COORDINATION:  finger-nose-finger, fine finger movements normal, no tremor  REFLEXES:  deep tendon reflexes present and symmetric  GAIT/STATION:  normal     DIAGNOSTIC DATA (LABS, IMAGING, TESTING) - I reviewed patient records, labs, notes, testing and imaging myself where available.  Lab Results  Component Value Date   WBC 10.0 02/25/2018   HGB 13.5 02/25/2018   HCT 40.7 02/25/2018   MCV 91.1 02/25/2018   PLT 242 02/25/2018      Component Value Date/Time   NA 139 02/25/2018 1719   K 4.0 02/25/2018 1719   CL 105 02/25/2018 1719   CO2 27 02/25/2018 1719   GLUCOSE 90 02/25/2018 1719   BUN 12 02/25/2018 1719   CREATININE 0.80  01/31/2022 1802   CALCIUM 9.8 02/25/2018 1719   PROT 7.5 08/04/2012 0513   ALBUMIN 4.0 08/04/2012 0513   AST 26 08/04/2012 0513   ALT 15 09/22/2015 0818   ALKPHOS 55 08/04/2012 0513   BILITOT 0.2 (L) 08/04/2012 0513   GFRNONAA >60 02/25/2018 1719   GFRAA >60 02/25/2018 1719   Lab Results  Component Value Date   CHOL 196 09/22/2015   HDL 64 09/22/2015   LDLCALC 112 09/22/2015   TRIG 99 09/22/2015   CHOLHDL 3.1 09/22/2015   No results found for: "HGBA1C" No results found for: "VITAMINB12" Lab Results  Component Value Date   TSH 4.138 02/26/2018   12/14/21 CMP, CBC, TSH wnl     ASSESSMENT AND PLAN  72 y.o. female with a history of HLD and HTN who presents for evaluation of memory loss over the past 1.5 years. While she intermittently seems more forgetful in high stress situations, she does not report any impact to her ADLs. MOCA score today is 25/30, in the range of mild cognitive impairment. Will check B12 level and order brain MRI to assess for signs of neurodegeneration and/or significant vascular disease. Discussed general brain health measures including aerobic and cognitive exercise. Will continue donepezil as she has found this helpful.    1. Memory loss       PLAN: - Labs: B12 - MRI  brain - Continue donepezil 10 mg daily  Orders Placed This Encounter  Procedures   MR BRAIN W WO CONTRAST   Vitamin B12    No orders of the defined types were placed in this encounter.   Return in about 8 months (around 03/05/2023).  I spent an average of 37 minutes chart reviewing and counseling the patient, with at least 50% of the time face to face with the patient. General brain health measures discussed, including the importance of regular aerobic exercise.   Genia Harold, MD 07/05/22 10:01 AM  [facility] [address], [place], [place] [address] [phone]

## 2022-07-05 NOTE — Patient Instructions (Signed)
A person with mild cognitive impairment (MCI) experiences memory problems greater than normally expected with aging, but not serious enough to interfere with daily activities.  The patient with MCI complains of difficulty with memory. Typically, the complaints include trouble remembering the names of people they met recently, trouble remembering the flow of a conversation, and an increased tendency to misplace things, or similar problems. In many cases, the individual will be aware of these difficulties and will compensate with increased reliance on notes and calendars.   Although there is an increased chances of going on to develop dementia, it is not possible currently to predict with certainty which patients with MCI will or will not go on to develop dementia. There is currently no specific treatment for MCI. People leading sedentary lifestyles are at greater risk for developing dementia. Increased physical activity and brain exercise can help with maintaining brain function.  Tasks to improve attention/working memory 1. Good sleep hygiene (7-8 hrs of sleep) 2. Learning a new skill (Painting, Carpentry, Pottery, new language, Knitting). 3.Cognitive exercises (keep a daily journal, Puzzles) 4. Physical exercise and training  (30 min/day X 4 days week) 5. Being on Antidepressant if needed 6.Yoga, Meditation, Tai Chi 7. Decrease alcohol intake 8.Have a clear schedule and structure in daily routine  MIND Diet: The Mediterranean-DASH Diet Intervention for Neurodegenerative Delay, or MIND diet, targets the health of the aging brain. Research participants with the highest MIND diet scores had a significantly slower rate of cognitive decline compared with those with the lowest scores. The effects of the MIND diet on cognition showed greater effects than either the Mediterranean or the DASH diet alone.  The healthy items the MIND diet guidelines suggest include:  3+ servings a day of whole grains 1+  servings a day of vegetables (other than green leafy) 6+ servings a week of green leafy vegetables 5+ servings a week of nuts 4+ meals a week of beans 2+ servings a week of berries 2+ meals a week of poultry 1+ meals a week of fish Mainly olive oil if added fat is used  The unhealthy items, which are higher in saturated and trans fat, include: Less than 5 servings a week of pastries and sweets Less than 4 servings a week of red meat (including beef, pork, lamb, and products made from these meats) Less than one serving a week of cheese and fried foods Less than 1 tablespoon a day of butter/stick margarine  

## 2022-07-05 NOTE — Telephone Encounter (Signed)
[employer] medicare sent to [facility] they obtain auth [id number] 

## 2022-07-06 LAB — VITAMIN B12: Vitamin B-12: 371 pg/mL (ref 232–1245)

## 2022-07-10 DIAGNOSIS — H43391 Other vitreous opacities, right eye: Secondary | ICD-10-CM | POA: Diagnosis not present

## 2022-07-10 DIAGNOSIS — H35363 Drusen (degenerative) of macula, bilateral: Secondary | ICD-10-CM | POA: Diagnosis not present

## 2022-07-10 DIAGNOSIS — H43811 Vitreous degeneration, right eye: Secondary | ICD-10-CM | POA: Diagnosis not present

## 2022-07-10 DIAGNOSIS — H04123 Dry eye syndrome of bilateral lacrimal glands: Secondary | ICD-10-CM | POA: Diagnosis not present

## 2022-07-12 ENCOUNTER — Encounter: Payer: Self-pay | Admitting: Psychiatry

## 2022-07-25 ENCOUNTER — Ambulatory Visit
Admission: RE | Admit: 2022-07-25 | Discharge: 2022-07-25 | Disposition: A | Discharge status: Home / Self Care | Payer: Medicare HMO | Source: Ambulatory Visit | Attending: Psychiatry | Admitting: Psychiatry

## 2022-07-25 DIAGNOSIS — R413 Other amnesia: Secondary | ICD-10-CM | POA: Diagnosis not present

## 2022-07-25 MED ORDER — GADOPICLENOL 0.5 MMOL/ML IV SOLN
6.0000 mL | Freq: Once | INTRAVENOUS | Status: AC | PRN
  Administered 2022-07-25: 6 mL via INTRAVENOUS

## 2022-07-26 ENCOUNTER — Encounter: Payer: Self-pay | Admitting: Psychiatry

## 2022-07-27 NOTE — Telephone Encounter (Signed)
Letter sent via My Chart.

## 2022-08-04 DIAGNOSIS — H43811 Vitreous degeneration, right eye: Secondary | ICD-10-CM | POA: Diagnosis not present

## 2022-08-04 DIAGNOSIS — H43391 Other vitreous opacities, right eye: Secondary | ICD-10-CM | POA: Diagnosis not present

## 2022-08-25 DIAGNOSIS — Z0100 Encounter for examination of eyes and vision without abnormal findings: Secondary | ICD-10-CM | POA: Diagnosis not present

## 2022-11-27 DIAGNOSIS — H6123 Impacted cerumen, bilateral: Secondary | ICD-10-CM | POA: Diagnosis not present

## 2022-11-27 DIAGNOSIS — H43391 Other vitreous opacities, right eye: Secondary | ICD-10-CM | POA: Diagnosis not present

## 2022-11-27 DIAGNOSIS — R42 Dizziness and giddiness: Secondary | ICD-10-CM | POA: Diagnosis not present

## 2022-11-28 DIAGNOSIS — R42 Dizziness and giddiness: Secondary | ICD-10-CM | POA: Diagnosis not present

## 2022-12-27 DIAGNOSIS — E7800 Pure hypercholesterolemia, unspecified: Secondary | ICD-10-CM | POA: Diagnosis not present

## 2022-12-27 DIAGNOSIS — Z0000 Encounter for general adult medical examination without abnormal findings: Secondary | ICD-10-CM | POA: Diagnosis not present

## 2022-12-27 DIAGNOSIS — R413 Other amnesia: Secondary | ICD-10-CM | POA: Diagnosis not present

## 2023-01-11 DIAGNOSIS — Z0000 Encounter for general adult medical examination without abnormal findings: Secondary | ICD-10-CM | POA: Diagnosis not present

## 2023-01-11 DIAGNOSIS — R413 Other amnesia: Secondary | ICD-10-CM | POA: Diagnosis not present

## 2023-01-11 DIAGNOSIS — E7800 Pure hypercholesterolemia, unspecified: Secondary | ICD-10-CM | POA: Diagnosis not present

## 2023-03-08 ENCOUNTER — Ambulatory Visit: Payer: Medicare HMO | Admitting: Psychiatry

## 2023-05-24 ENCOUNTER — Other Ambulatory Visit: Payer: Self-pay

## 2023-05-24 DIAGNOSIS — Z1231 Encounter for screening mammogram for malignant neoplasm of breast: Secondary | ICD-10-CM

## 2023-06-08 ENCOUNTER — Ambulatory Visit: Payer: Medicare HMO

## 2023-06-13 ENCOUNTER — Telehealth: Payer: Self-pay | Admitting: Psychiatry

## 2023-06-13 NOTE — Telephone Encounter (Signed)
 Pt's son called and requested a f/u. Pt scheduled for a f/u with Dr Omar Bibber for memory, son will bring POA and is aware pt will need to complete a DPR  to add whoever else she wants to allow access to her information in this office.

## 2023-06-19 ENCOUNTER — Ambulatory Visit
Admission: RE | Admit: 2023-06-19 | Discharge: 2023-06-19 | Disposition: A | Discharge status: Home / Self Care | Payer: Medicare HMO | Source: Ambulatory Visit

## 2023-06-19 DIAGNOSIS — Z1231 Encounter for screening mammogram for malignant neoplasm of breast: Secondary | ICD-10-CM

## 2023-06-22 ENCOUNTER — Other Ambulatory Visit: Payer: Self-pay

## 2023-06-22 DIAGNOSIS — R928 Other abnormal and inconclusive findings on diagnostic imaging of breast: Secondary | ICD-10-CM

## 2023-07-05 ENCOUNTER — Ambulatory Visit: Payer: Medicare HMO

## 2023-07-05 ENCOUNTER — Ambulatory Visit
Admission: RE | Admit: 2023-07-05 | Discharge: 2023-07-05 | Disposition: A | Discharge status: Home / Self Care | Payer: Medicare HMO | Source: Ambulatory Visit

## 2023-07-05 DIAGNOSIS — R928 Other abnormal and inconclusive findings on diagnostic imaging of breast: Secondary | ICD-10-CM | POA: Diagnosis not present

## 2023-07-10 DIAGNOSIS — E7800 Pure hypercholesterolemia, unspecified: Secondary | ICD-10-CM | POA: Diagnosis not present

## 2023-07-10 DIAGNOSIS — R4189 Other symptoms and signs involving cognitive functions and awareness: Secondary | ICD-10-CM | POA: Diagnosis not present

## 2023-07-10 DIAGNOSIS — G720 Drug-induced myopathy: Secondary | ICD-10-CM | POA: Diagnosis not present

## 2023-07-10 DIAGNOSIS — T466X5A Adverse effect of antihyperlipidemic and antiarteriosclerotic drugs, initial encounter: Secondary | ICD-10-CM | POA: Diagnosis not present

## 2023-08-01 DIAGNOSIS — Z9181 History of falling: Secondary | ICD-10-CM | POA: Diagnosis not present

## 2023-08-01 DIAGNOSIS — T466X5D Adverse effect of antihyperlipidemic and antiarteriosclerotic drugs, subsequent encounter: Secondary | ICD-10-CM | POA: Diagnosis not present

## 2023-08-01 DIAGNOSIS — G720 Drug-induced myopathy: Secondary | ICD-10-CM | POA: Diagnosis not present

## 2023-08-01 DIAGNOSIS — H539 Unspecified visual disturbance: Secondary | ICD-10-CM | POA: Diagnosis not present

## 2023-08-01 DIAGNOSIS — I10 Essential (primary) hypertension: Secondary | ICD-10-CM | POA: Diagnosis not present

## 2023-08-01 DIAGNOSIS — E7800 Pure hypercholesterolemia, unspecified: Secondary | ICD-10-CM | POA: Diagnosis not present

## 2023-08-06 DIAGNOSIS — I10 Essential (primary) hypertension: Secondary | ICD-10-CM | POA: Diagnosis not present

## 2023-08-06 DIAGNOSIS — G720 Drug-induced myopathy: Secondary | ICD-10-CM | POA: Diagnosis not present

## 2023-08-06 DIAGNOSIS — T466X5D Adverse effect of antihyperlipidemic and antiarteriosclerotic drugs, subsequent encounter: Secondary | ICD-10-CM | POA: Diagnosis not present

## 2023-08-06 DIAGNOSIS — E7800 Pure hypercholesterolemia, unspecified: Secondary | ICD-10-CM | POA: Diagnosis not present

## 2023-08-07 DIAGNOSIS — Z9181 History of falling: Secondary | ICD-10-CM | POA: Diagnosis not present

## 2023-08-07 DIAGNOSIS — H539 Unspecified visual disturbance: Secondary | ICD-10-CM | POA: Diagnosis not present

## 2023-08-07 DIAGNOSIS — E7800 Pure hypercholesterolemia, unspecified: Secondary | ICD-10-CM | POA: Diagnosis not present

## 2023-08-07 DIAGNOSIS — G720 Drug-induced myopathy: Secondary | ICD-10-CM | POA: Diagnosis not present

## 2023-08-07 DIAGNOSIS — I10 Essential (primary) hypertension: Secondary | ICD-10-CM | POA: Diagnosis not present

## 2023-08-07 DIAGNOSIS — T466X5D Adverse effect of antihyperlipidemic and antiarteriosclerotic drugs, subsequent encounter: Secondary | ICD-10-CM | POA: Diagnosis not present

## 2023-08-09 DIAGNOSIS — Z9181 History of falling: Secondary | ICD-10-CM | POA: Diagnosis not present

## 2023-08-09 DIAGNOSIS — G720 Drug-induced myopathy: Secondary | ICD-10-CM | POA: Diagnosis not present

## 2023-08-09 DIAGNOSIS — T466X5D Adverse effect of antihyperlipidemic and antiarteriosclerotic drugs, subsequent encounter: Secondary | ICD-10-CM | POA: Diagnosis not present

## 2023-08-09 DIAGNOSIS — I10 Essential (primary) hypertension: Secondary | ICD-10-CM | POA: Diagnosis not present

## 2023-08-09 DIAGNOSIS — H539 Unspecified visual disturbance: Secondary | ICD-10-CM | POA: Diagnosis not present

## 2023-08-09 DIAGNOSIS — E7800 Pure hypercholesterolemia, unspecified: Secondary | ICD-10-CM | POA: Diagnosis not present

## 2023-08-15 DIAGNOSIS — Z9181 History of falling: Secondary | ICD-10-CM | POA: Diagnosis not present

## 2023-08-15 DIAGNOSIS — E7800 Pure hypercholesterolemia, unspecified: Secondary | ICD-10-CM | POA: Diagnosis not present

## 2023-08-15 DIAGNOSIS — T466X5D Adverse effect of antihyperlipidemic and antiarteriosclerotic drugs, subsequent encounter: Secondary | ICD-10-CM | POA: Diagnosis not present

## 2023-08-15 DIAGNOSIS — G720 Drug-induced myopathy: Secondary | ICD-10-CM | POA: Diagnosis not present

## 2023-08-15 DIAGNOSIS — I10 Essential (primary) hypertension: Secondary | ICD-10-CM | POA: Diagnosis not present

## 2023-08-15 DIAGNOSIS — H539 Unspecified visual disturbance: Secondary | ICD-10-CM | POA: Diagnosis not present

## 2023-08-16 DIAGNOSIS — E7800 Pure hypercholesterolemia, unspecified: Secondary | ICD-10-CM | POA: Diagnosis not present

## 2023-08-16 DIAGNOSIS — G720 Drug-induced myopathy: Secondary | ICD-10-CM | POA: Diagnosis not present

## 2023-08-16 DIAGNOSIS — Z9181 History of falling: Secondary | ICD-10-CM | POA: Diagnosis not present

## 2023-08-16 DIAGNOSIS — T466X5D Adverse effect of antihyperlipidemic and antiarteriosclerotic drugs, subsequent encounter: Secondary | ICD-10-CM | POA: Diagnosis not present

## 2023-08-16 DIAGNOSIS — I10 Essential (primary) hypertension: Secondary | ICD-10-CM | POA: Diagnosis not present

## 2023-08-16 DIAGNOSIS — H539 Unspecified visual disturbance: Secondary | ICD-10-CM | POA: Diagnosis not present

## 2023-08-20 ENCOUNTER — Encounter: Payer: Self-pay | Admitting: Psychology

## 2023-08-29 DIAGNOSIS — H539 Unspecified visual disturbance: Secondary | ICD-10-CM | POA: Diagnosis not present

## 2023-08-29 DIAGNOSIS — G720 Drug-induced myopathy: Secondary | ICD-10-CM | POA: Diagnosis not present

## 2023-08-29 DIAGNOSIS — E7800 Pure hypercholesterolemia, unspecified: Secondary | ICD-10-CM | POA: Diagnosis not present

## 2023-08-29 DIAGNOSIS — Z9181 History of falling: Secondary | ICD-10-CM | POA: Diagnosis not present

## 2023-08-29 DIAGNOSIS — I10 Essential (primary) hypertension: Secondary | ICD-10-CM | POA: Diagnosis not present

## 2023-08-29 DIAGNOSIS — T466X5D Adverse effect of antihyperlipidemic and antiarteriosclerotic drugs, subsequent encounter: Secondary | ICD-10-CM | POA: Diagnosis not present

## 2023-09-03 ENCOUNTER — Telehealth: Payer: Self-pay | Admitting: Psychiatry

## 2023-09-03 NOTE — Telephone Encounter (Signed)
 Appointment details confirmed

## 2023-09-05 ENCOUNTER — Ambulatory Visit: Payer: Medicare HMO | Admitting: Neurology

## 2023-09-05 ENCOUNTER — Telehealth: Payer: Self-pay | Admitting: Neurology

## 2023-09-05 NOTE — Telephone Encounter (Signed)
 [facility] cancellation

## 2023-09-12 ENCOUNTER — Encounter: Payer: Self-pay | Admitting: Psychology

## 2023-09-12 ENCOUNTER — Encounter: Attending: Psychology | Admitting: Psychology

## 2023-09-12 DIAGNOSIS — R413 Other amnesia: Secondary | ICD-10-CM | POA: Insufficient documentation

## 2023-09-12 DIAGNOSIS — F0390 Unspecified dementia without behavioral disturbance: Secondary | ICD-10-CM | POA: Diagnosis not present

## 2023-09-12 NOTE — Progress Notes (Signed)
 NEUROPSYCHOLOGICAL EVALUATION [place]. [facility]  Physical Medicine and Rehabilitation     Patient: Brandy Ford  MRN: [id number] DOB: 10-Jan-1951  Age: 73 y.o. Sex: female  Race/Ethnicity: White or Caucasian  Years of Education: 12   Collateral Information Source: Donelda Fujita (Son)  Referring Provider: Yvonnie Heritage, NP  Provider/Clinical Neuropsychologist: Loletta Ripple, PsyD  Date of Service: 09/12/2023 Start Time: 8 AM End Time: 10 AM  Location of Service:  [facility] Physical Medicine & Rehabilitation Department [place]. [facility] [address] [place] [place], [place] [address] Phone: [phone]  Billing Code/Service:            96116/96121  Individuals Present: Patient was seen, accompanied by her son with her consent, in-person, by the provider in the provider's outpatient office. 1 hour and 15 minutes spent in face-to-face clinical interview and remaining 45 minutes was spent in record review, documentation, and testing protocol construction.    PATIENT CONSENT AND CONFIDENTIALITY The patient's understanding of the reason for referral was intact. Discussed limits of confidentiality including, but not limited to, posting of final evaluation report in the patient's electronic medical record for both the patient and for the referring provider and appropriate medical professionals. Patient was given the opportunity to have their questions answered. The neuropsychological evaluation process was discussed with the patient and they consented to proceed with the evaluation.  Consent for Evaluation and Treatment: Signed: Yes Explanation of Privacy Policies: Signed: Yes Discussion of Confidentiality Limits: Yes  REASON FOR REFERRAL:The patient was referred for neuropsychological evaluation by primary care provider Yvonnie Heritage, NP, due to concerns for cognitive decline. Per medical records, the patient was previously receiving care from  [facility] and her last appointment was on 07/05/2022. At that time, there were concerns for memory loss starting 1.5 years earlier. MoCA score was within the mild cognitive impairment range with 25 out of 30. B12 level and MRI was ordered. She was previously prescribed donepezil and this was continued. MRI of the brain on 07/25/2022 was reported to show "age advanced generalized cerebral atrophy and mild changes of chronic small vessel disease. B12 levels were reportedly normal. The patient was referred for memory testing at that time but was never scheduled. The patient had an appointment with primary care / the referring provider on 07/10/2023. During that appointment more significant declines in cognitive and daily functioning were described by the patient's family. Neuropsychological evaluation was again ordered.    Upon interview, the patient denied noticing any significant cognitive changes. The patient's son (also HCPOA) reported noticing significant declines in cognitive functioning and was hoping to better understand the nature of the patient's cognitive changes in order to better provide care and support.    HISTORY OF PRESENTING CONCERNS: The following was obtained from the patient and the patient's son through clinical interview.   Cognitive Symptom Onset & Course: Collateral (Son) reported that cognitive changes were first noticed around four years ago. Course was reported to be progressive. The patient's son indicated that the degree of cognitive difficulties did not become apparent until declines in the health of the patient's significant other led to more regular contact with the patient. The patient has since moved into the home of her son and his family in order to ensure she had adequate care and supports.   Current Cognitive Complaints: The patient herself denied any cognitive symptoms. The following are based on her son's reports. Memory: Difficulties with remembering  conversations, recent  events, and repeating herself unintentionally. Receives assistance with medical appointments and medication management from family.  Processing Speed: Reduced speed relative to baseline.   Attention & Concentration: Declines in attention and concentration.   Language: Relatively mild word-finding difficulties. No frequent paraphasic errors. No clear indications of changes and receptive language. Visual-Spatial: Descriptions were not suggestive of marked visual-spatial deficits. Executive Functioning: Declines in decision making and problem solving abilities.  Reduced frustration tolerance and increased irritability, particularly within the context of disorientation and confusion. Disorientation and confusion are not clearly linked to discrete episodes of alterations and awareness.  No clear indications of social disinhibition are noted.  Episodes of confusion involving making phone calls in the middle of the night reported.  Motor/Sensory Complaints:   Frequent instances of dizziness/vertigo: Denied. Sensory changes: No significant changes in sense of smell or taste.  No significant changes in vision or hearing. Balance/coordination difficulties: Slight declines in balance and a slight shuffling gait Other motor difficulties: No problems with tremor or frequent falls.  Emotional and Behavioral Functioning: The patient denied any history of depression, anxiety, or other mental health concerns.  The patient's son reported observing some struggles with anxiety linked to disorientation and confusion.  Denied current or past hallucinations, suicidal ideation, homicidal ideation, or any clear indications of psychopathology beyond those noted thus far.  Sleep:  Estimated hours obtained each night: 6 hours Difficulties falling asleep: Denied Difficulties staying asleep: Denied History of obstructive sleep apnea: Denied History of vivid dreaming: Denied Excessive movement while  asleep: Denied Instances of acting out her dreams: Denied Appetite: Slight increase in weight since staying with son. Caffeine: 1 cup of coffee per day. Alcohol Use: Reported consuming a glass or two of wine per night to assist with sleep.  Psychoeducation regarding impact of alcohol and sleep quality provided by the provider during the intake appointment. Tobacco Use: Denied. Recreational Substance Use: Denied.  Level of Functional Independence: The patient is dependent for basic activities of daily living (cueing and structure needed for hygiene and dressing) and all instrumental activities of daily living (medications, appointments, etc).  The patient stopped driving about 6 years ago due to anxiety.  Medical History/Record Review: Hyperlipidemia, hypertension.  No history of TBI. History of traumatic brain injury/concussion: Denied History of stroke: Denied History of heart attack: Denied History of cancer/chemotherapy: Denied History of seizure activity: Denied Symptoms of chronic pain: Denied    Past Medical History:  Diagnosis Date   High blood cholesterol    Hypertension     Patient Active Problem List   Diagnosis Date Noted   Mild cognitive impairment 07/04/2022   Closed fracture of distal end of left radius 07/01/2020   Hyperlipidemia 06/10/2015   Statin intolerance 06/10/2015    Imaging/Lab Results:   "STUDY DATE: 07/25/2022 ORDERING CLINICIAN: Dr. Billy Bue CLINICAL HISTORY: 73 year old patient being evaluated for memory loss COMPARISON FILMS: CT head without contrast 03/25/2020 EXAM: MRI brain with and without contrast FINDINGS: The brain parenchyma shows mild age-appropriate changes of chronic small vessel disease but moderate degree of generalized cerebral atrophy and dilatation of subarachnoid space and ventricular system which is age disproportionate.  No structural lesion, tumor or infarct is noted.  Diffusion-weighted imaging is negative for acute ischemia.   Gradient-echo sequences do not show significant microhemorrhages.  The pituitary gland and cerebellar tonsils appear normal.  Extra-axial brain structures appear normal.  Calvarium shows no abnormalities.  Orbits appear unremarkable.  Paranasal sinuses show only minor chronic mucosal thickening.  Flow-voids of large  vessel intracranial circulation appear to be patent however posterior circulation flow-voids appear to be hypoplastic.  Postcontrast images do not result in abnormal areas of enhancement. IMPRESSION: MRI scan of the brain with and without contrast showing age advanced generalized cerebral atrophy and mild changes of chronic small vessel disease.  No acute abnormality or abnormal enhancing lesions are noted"  Family Neurologic/Medical Hx: Dementia and her sister (sister is her senior by 2 years), onset around at least 2 years ago.  Family History  Problem Relation Age of Onset   BRCA 1/2 Neg Hx    Breast cancer Neg Hx    Medications:  Asprin 81 mg Donepezil hcl 10 mg  Ezetimibe  10mg  Misc natural products.  Prevagen  Academic/Vocational History: Highest level of educational attainment: 12 History of developmental delay: None History of grade repetition: None Enrollment in special education courses: None History of LD/ADHD: None Employment: Stopped working approximately 7 years ago.  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  At one point also worked as [employer].   Psychosocial: Marital Status: Currently unmarried.  Three prior marriages. Children/Grandchildren: Two adult children and four grandchildren. Living Situation: Lives with son, daughter-in-law, and two grandchildren. Daily Activities/Hobbies: Coloring and drawing  Mental Status/Behavioral Observations: The patient was seen on an outpatient basis in the [facility] PM&R office for the clinical interview accompanied by her son [facility]) with her permission. Sensorium/Arousal: The patient was alert and engaged throughout  the evaluation.  Hearing and vision appeared grossly intact. Appearance: Appropriate for the setting. Behavior: No indications of impulsivity or disinhibition. Speech/Language: Conversational speech was prosodic, fluent, and well-articulated.  No clear receptive language based difficulties observed during the interview. Motor: Within normal limits. Social Comportment: Talkative. Mood/Affect: Occasionally became frustrated and dysphoric in disagreement with son's observations.  Some indications of anxiety.  Primarily euthymic in mood.  Affect was congruent with mood Thought Process/Content: Slightly perseverative.  Relatively quick to respond but content of her response did not always clearly aligned with the question posed.  Ability to Participate in Interview: Some difficulty with answering questions around personal history with indications of confabulation and general difficulties with memory.  Insight: Impaired   SUMMARY / CLINICAL IMPRESSIONS The patient was referred for neuropsychological evaluation by primary care provider Yvonnie Heritage, NP, due to concerns for cognitive decline. Per medical records, the patient was previously receiving care from [facility] and her last appointment was on 07/05/2022. At that time, there were concerns for memory loss starting 1.5 years earlier. MoCA score was within the mild cognitive impairment range with 25 out of 30. B12 level and MRI was ordered. She was previously prescribed donepezil and this was continued. MRI of the brain on 07/25/2022 was reported to show "age advanced generalized cerebral atrophy and mild changes of chronic small vessel disease. B12 levels were reportedly normal. The patient was referred for memory testing at that time but was never scheduled. The patient had an appointment with primary care / the referring provider on 07/10/2023. During that appointment more significant declines in cognitive and daily functioning were  described by the patient's family. Neuropsychological evaluation was again ordered.    Upon interview, the patient denied noticing any significant cognitive changes. The patient's son (also HCPOA) reported noticing significant declines in cognitive functioning and was hoping to better understand the nature of the patient's cognitive changes in order to better provide care and support.  Collateral report describes significant declines in cognitive functioning that is most significant within the domain of  short-term memory.  Reports indicate significant declines and independent functioning such that assistance is required for some basic activities of daily living.  The patient's behavioral presentation during the interview is concerning for anosognosia. A formal neurocognitive evaluation is indicated to better delineate the patient's cognitive profile and facilitate differential diagnosis for what is suspected to reflect a neurodegenerative disease process.   DISPOSITION / PLAN  The patient has been set up for a formal neuropsychological assessment to objectively assess her cognitive functioning across domains to establish the patient's cognitive profile. This data, in conjunction with information obtained via clinical interview and medical record review, will help clarify likely etiology and guide treatment recommendations. Once data collection and interpretation have been completed, the findings / diagnosis and recommendations will be reviewed and discussed with the patient during a feedback appointment with the neuropsychologist. Based on the collaborative dialogue with the patient during the feedback, recommendations may be adjusted / tailored as needed. A formal report will be produced and provided to the patient and the referring provider.   Diagnosis: Memory Loss    This report was generated using voice recognition software. While this document has been carefully reviewed, transcription errors may  be present. I apologize in advance for any inconvenience. Please contact me if further clarification is needed.             Loletta Ripple, PsyD             Neuropsychologist

## 2023-09-12 NOTE — Addendum Note (Signed)
 Addended byAleene Hurry on: 09/12/2023 02:15 PM   Modules accepted: Level of Service

## 2023-09-18 DIAGNOSIS — I10 Essential (primary) hypertension: Secondary | ICD-10-CM | POA: Diagnosis not present

## 2023-09-18 DIAGNOSIS — E7800 Pure hypercholesterolemia, unspecified: Secondary | ICD-10-CM | POA: Diagnosis not present

## 2023-09-18 DIAGNOSIS — H539 Unspecified visual disturbance: Secondary | ICD-10-CM | POA: Diagnosis not present

## 2023-09-18 DIAGNOSIS — Z9181 History of falling: Secondary | ICD-10-CM | POA: Diagnosis not present

## 2023-09-18 DIAGNOSIS — T466X5D Adverse effect of antihyperlipidemic and antiarteriosclerotic drugs, subsequent encounter: Secondary | ICD-10-CM | POA: Diagnosis not present

## 2023-09-18 DIAGNOSIS — G720 Drug-induced myopathy: Secondary | ICD-10-CM | POA: Diagnosis not present

## 2023-09-21 ENCOUNTER — Encounter

## 2023-09-21 DIAGNOSIS — F0390 Unspecified dementia without behavioral disturbance: Secondary | ICD-10-CM

## 2023-09-21 DIAGNOSIS — R413 Other amnesia: Secondary | ICD-10-CM | POA: Diagnosis not present

## 2023-09-21 NOTE — Progress Notes (Signed)
 Behavioral Observations: The patient was mostly oriented to self (able to provide name and DOB, but was unable to provide age). She was not oriented to place or time. She was alert. Her hearing and vision were adequate for testing. She ambulated independently and without issue. No hand tremor was noted during testing. Her speech was mostly prosodic, fluent, and well-articulated. She displayed no clear indications of word-finding difficulties in conversational speech and no paraphasic errors were noted. Receptive language appeared intact. The patient's exhibited frequent tearfulness throughout the testing session, particularly when struggling to generate responses during tests. Although discussed, the patient expressed uncertainty about the purpose of testing. During the session, patient's son was contacted and spoke with the patient to provide reassurance, encouragement, and to reinforce the purpose of testing. This helped reduce the patient's distress. The patient appeared to engage with testing to the best of her ability, although difficulties with emotion regulation and comprehending testing instructions on more complex tasks were notable. The patient was talkative at times throughout testing, sometimes during the test administration itself. There were indications of forgetfulness in conversation in that the patient repeatedly shared the same detail about her personal history, seemingly without awareness. Her attention was not easily redirected. Social interactions were otherwise unremarkable.   Neuropsychology Note Yolanda Hence Nephew completed 140 minutes of neuropsychological testing with technician, Rhett Cella, BA, under the supervision of Aleene Hurry, PsyD., Clinical Neuropsychologist. The patient did not appear overtly distressed by the testing session, per behavioral observation or via self-report to the technician. Rest breaks were offered.   Clinical Decision Making: In considering the  patient's current level of functioning, level of presumed impairment, nature of symptoms, emotional and behavioral responses during clinical interview, level of literacy, and observed level of motivation/effort, a battery of tests was selected by Dr. Georgeanne King during initial consultation on 09/12/2023. This was communicated to the technician. Communication between the neuropsychologist and technician was ongoing throughout the testing session and changes were made as deemed necessary based on patient performance on testing, technician observations and additional pertinent factors such as those listed above.  Tests Administered: Clock Drawing Test Controlled Oral Word Association Test (FAS & Animals) Grooved Pegboard Test Repeatable Battery for the Assessment of Neuropsychological Status Update (RBANS), Form C Trail Making Test (TMT; Part A & B)  Results: Note: This summary of test scores accompanies the interpretive report and should not be interpreted by unqualified individuals or in isolation without reference to the report. Test scores are relative to age, gender, and educational history as available and appropriate.   Measurement properties of test scores: IQ, Index, and Standard Scores (SS): Mean = 100; Standard Deviation = 15; Scaled Scores (ss): Mean = 10; Standard Deviation = 3; Z scores (Z): Mean = 0; Standard Deviation = 1; T scores (T); Mean = 50; Standard Deviation = 10  Intellectual/Premorbid Functioning Estimate   Norm Score Percentile  Range  Wechsler Test of Adult Reading  SS = 71 3 %ile Below Average   ATTENTION AND WORKING MEMORY    Norm Score Percentile  Range  RBANS          Digit Span  ss = 4 2 %ile Below Average   PROCESSING SPEED       Range  RBANS          Coding       Discontinued (Unable to complete practice/learning trial)   PSYCHOMOTION    Norm Score Percentile  Range  Grooved Pegboard - Dominant  t = 20 0.1 %ile Exceptionally Low   # of drops    0     Grooved  Pegboard - Nondominant  t = 22 0.2 %ile Exceptionally Low   # of drops    0      LANGUAGE    Norm Score Percentile  Range  COWAT          FAS  t = 18 0.1 %ile Exceptionally Low   Animals  t = 11 <0.1 %ile Exceptionally Low  RBANS           Picture Naming     <=2nd %ile  Exceptionally Low    Semantic Fluency  ss = 1 0.1 %ile Exceptionally Low   EXECUTIVE FUNCTIONING       Range  Trails A       Discontinued (Time Limit)   Errors         Trails B       Discontinued (Unable to progress)   Errors          MEMORY    Norm Score Percentile  Range  RBANS          Immediate Memory Index  SS = 40 <0.1 %ile Exceptionally Low   Delayed Memory Index  SS = 40 <0.1 %ile Exceptionally Low   List Learning  ss = 1 0.1 %ile Exceptionally Low   Story Memory  ss = 1 0.1 %ile Exceptionally Low   List Recall     <=2nd %ile %ile Exceptionally Low   List Recognition     <=2nd %ile %ile Exceptionally Low   Story Recall  ss = 1 0.1 %ile Exceptionally Low   Figure Recall  ss = 1 0.1 %ile Exceptionally Low   VISUAL-SPATIAL    Norm Score Percentile  Range  Clock                   RBANS Visuospatial Index  SS = 50 <0.1 %ile Exceptionally Low   RBANS Figure Copy  ss = 1 0.1 %ile Exceptionally Low   RBANS Line Orientation Attempted      <=2nd %ile %ile Exceptionally Low    Feedback to Patient: Quentina Fronek will return on 09/28/2023 for an interactive feedback session with Dr. Georgeanne King at which time her test performances, clinical impressions and treatment recommendations will be reviewed in detail. The patient understands she can contact our office should she require our assistance before this time.  140 minutes spent face-to-face with patient administering standardized tests, 30 minutes spent scoring Radiographer, therapeutic). [CPT [id number], [id number]]  Full report to follow.

## 2023-09-24 ENCOUNTER — Encounter (HOSPITAL_BASED_OUTPATIENT_CLINIC_OR_DEPARTMENT_OTHER): Admitting: Psychology

## 2023-09-24 DIAGNOSIS — F0390 Unspecified dementia without behavioral disturbance: Secondary | ICD-10-CM

## 2023-09-28 ENCOUNTER — Encounter: Admitting: Psychology

## 2023-09-28 DIAGNOSIS — F0390 Unspecified dementia without behavioral disturbance: Secondary | ICD-10-CM

## 2023-09-28 DIAGNOSIS — R413 Other amnesia: Secondary | ICD-10-CM | POA: Diagnosis not present

## 2023-09-28 NOTE — Progress Notes (Signed)
   NEUROPSYCHOLOGICAL EVALUATION [place]. [facility]  Physical Medicine and Rehabilitation     Patient: Brandy Ford  MRN: [id number] DOB: 03/29/1951   Service Provider/Clinical Neuropsychologist: Loletta Ripple, PsyD  Date of Service: 09/28/2023 Start Time: 11 AM End Time: 11:33 AM  Location of Service:  [facility] Physical Medicine & Rehabilitation Department [place]. [facility] [address], [place]. [address] [place], [place] [address] Phone: [phone]   Billing Code/Service: [phone]    Individuals present: Patient's son and health care power of attorney, Donelda Fujita, and the provider Loletta Ripple, PsyD). The patient's son contacted the provider prior to the appointment to discuss the possibility of having the feedback appointment without the patient present. Daryl expressed concern about the patient's emotional / cognitive struggles within the context of the feedback appointment and the ability to discuss the results and recommendations fully without causing marked distress in the patient. The provider consulted with another neuropsychologist in the practice and determined that given the severity of cognitive impairments, marked difficulty in emotion regulation in the patient observed during the intake and testing visits, the patient's son's legal role in the patient's care, and consistent indications that her son's priority is the wellbeing of the patient, this was determined to be appropriate.    Provider conducted the 60-minute interactive feedback appointment in-person with the patient's HCPOA / son.  The provider reviewed and discussed the results of neuropsychological evaluation. Follow-up interviewing was conducted as needed to refine interpretation of findings as needed. Review of results included overall findings, diagnosis, and treatment planning/recommendations that were derived from integration of patient data, interpretation of standardized rest  results and clinical data, and clinical decision making, which are documented in the patient's electronic medical record with the full report (date listed below). A copy of the full report will also be mailed to the patient.   The patient's son expressed understanding of the information reviewed. The patient's son was provided opportunity to ask questions which were then answered by the provider. The provider worked collaboratively to tailor treatment recommendations to the patient's sonwhen possible. The patient was informed they could reach out to the provider should additional questions related to the evaluation arise.    The final neuropsychological evaluation report, documented in the patient's chart on 09/24/23 , was amended to reflect any additional information obtained during the feedback appointment including treatment planning collaboration.    This report was generated using voice recognition software. While this document has been carefully reviewed, transcription errors may be present. I apologize in advance for any inconvenience. Please contact me if further clarification is needed.             Loletta Ripple, PsyD             Neuropsychologist

## 2023-09-28 NOTE — Progress Notes (Signed)
 NEUROPSYCHOLOGICAL EVALUATION [place]. [facility]  Physical Medicine and Rehabilitation     Patient: Brandy Ford  MRN: [id number] DOB: 1950/08/17  Age: 73 y.o. Sex: female  Race/Ethnicity: White or Caucasian  Years of Education: 12   Collateral Information Source: Donelda Fujita (Son)  Referring Provider: Yvonnie Heritage, NP  Provider/Clinical Neuropsychologist: Loletta Ripple, PsyD  Date of Service: 09/24/2023 Start Time: 11 AM End Time: 12 PM  Location of Service:  [facility] Physical Medicine & Rehabilitation Department [facility] [address] [place]. [address] [place], [place] [address] Phone: [phone]  Billing Code/Service: [phone]   Individuals Present: Aleene Hurry, PsyD  1 hour was spent on interpretation of patient data, interpretation of standardized test results and clinical data, clinical decision making, initial treatment planning/recommendations, and report writing. The report will be amended as needed based on any additional information collected during interactive feedback session.   REASON FOR REFERRAL:The patient was referred for neuropsychological evaluation by primary care provider Yvonnie Heritage, NP, due to concerns for cognitive decline. Per medical records, the patient was previously receiving care from [facility] and her last appointment was on 07/05/2022. At that time, there were concerns for memory loss starting 1.5 years earlier. MoCA score was within the mild cognitive impairment range with 25 out of 30. B12 level and MRI was ordered. She was previously prescribed donepezil and this was continued. MRI of the brain on 07/25/2022 was reported to show "age advanced generalized cerebral atrophy and mild changes of chronic small vessel disease. B12 levels were reportedly normal. The patient was referred for memory testing at that time but was never scheduled. The patient had an appointment with primary care / the  referring provider on 07/10/2023. During that appointment more significant declines in cognitive and daily functioning were described by the patient's family. Neuropsychological evaluation was again ordered.    Upon interview, the patient denied noticing any significant cognitive changes. The patient's son (also HCPOA) reported noticing significant declines in cognitive functioning and was hoping to better understand the nature of the patient's cognitive changes in order to better provide care and support.    HISTORY OF PRESENTING CONCERNS: The following was obtained from the patient and the patient's son through clinical interview.   Cognitive Symptom Onset & Course: Collateral (Son) reported that cognitive changes were first noticed around four years ago. Course was reported to be progressive. The patient's son indicated that the degree of cognitive difficulties did not become apparent until declines in the health of the patient's significant other led to more regular contact with the patient. The patient has since moved into the home of her son and his family in order to ensure she had adequate care and supports.   Current Cognitive Complaints: The patient herself denied any cognitive symptoms. The following are based on her son's reports. Memory: Difficulties with remembering conversations, recent events, and repeating herself unintentionally. Receives assistance with medical appointments and medication management from family.  Processing Speed: Reduced speed relative to baseline.   Attention & Concentration: Declines in attention and concentration.   Language: Relatively mild word-finding difficulties. No frequent paraphasic errors. No clear indications of changes and receptive language. Visual-Spatial: Descriptions were not suggestive of marked visual-spatial deficits. Executive Functioning: Declines in decision making and problem solving abilities.  Reduced frustration tolerance and increased  irritability, particularly within the context of disorientation and confusion. Disorientation and confusion are not clearly linked to discrete episodes of alterations and awareness.  No clear indications of social disinhibition are noted.  Episodes of confusion involving making phone calls in the middle of the night reported.  Motor/Sensory Complaints:   Frequent instances of dizziness/vertigo: Denied. Sensory changes: No significant changes in sense of smell or taste.  No significant changes in vision or hearing. Balance/coordination difficulties: Slight declines in balance and a slight shuffling gait Other motor difficulties: No problems with tremor or frequent falls.  Emotional and Behavioral Functioning: The patient denied any history of depression, anxiety, or other mental health concerns.  The patient's son reported observing some struggles with anxiety linked to disorientation and confusion.  Denied current or past hallucinations, suicidal ideation, homicidal ideation, or any clear indications of psychopathology beyond those noted thus far.  Sleep:  Estimated hours obtained each night: 6 hours Difficulties falling asleep: Denied Difficulties staying asleep: Denied History of obstructive sleep apnea: Denied History of vivid dreaming: Denied Excessive movement while asleep: Denied Instances of acting out her dreams: Denied Appetite: Slight increase in weight since staying with son. Caffeine: 1 cup of coffee per day. Alcohol Use: Reported consuming a glass or two of wine per night to assist with sleep.  Psychoeducation regarding impact of alcohol and sleep quality provided by the provider during the intake appointment. Tobacco Use: Denied. Recreational Substance Use: Denied.  Level of Functional Independence: The patient is dependent for basic activities of daily living (cueing and structure needed for hygiene and dressing) and all instrumental activities of daily living (medications,  appointments, etc).  The patient stopped driving about 6 years ago due to anxiety.  Medical History/Record Review: Hyperlipidemia, hypertension.  No history of TBI. History of traumatic brain injury/concussion: None History of stroke: None History of heart attack: None History of cancer/chemotherapy: None History of seizure activity: None Symptoms of chronic pain: None  Past Medical History:  Diagnosis Date   High blood cholesterol    Hypertension    Patient Active Problem List   Diagnosis Date Noted   Mild cognitive impairment 07/04/2022   Closed fracture of distal end of left radius 07/01/2020   Hyperlipidemia 06/10/2015   Statin intolerance 06/10/2015    Imaging/Lab Results:   "STUDY DATE: 07/25/2022 ORDERING CLINICIAN: Dr. Billy Bue CLINICAL HISTORY: 73 year old patient being evaluated for memory loss COMPARISON FILMS: CT head without contrast 03/25/2020 EXAM: MRI brain with and without contrast FINDINGS: The brain parenchyma shows mild age-appropriate changes of chronic small vessel disease but moderate degree of generalized cerebral atrophy and dilatation of subarachnoid space and ventricular system which is age disproportionate.  No structural lesion, tumor or infarct is noted.  Diffusion-weighted imaging is negative for acute ischemia.  Gradient-echo sequences do not show significant microhemorrhages.  The pituitary gland and cerebellar tonsils appear normal.  Extra-axial brain structures appear normal.  Calvarium shows no abnormalities.  Orbits appear unremarkable.  Paranasal sinuses show only minor chronic mucosal thickening.  Flow-voids of large vessel intracranial circulation appear to be patent however posterior circulation flow-voids appear to be hypoplastic.  Postcontrast images do not result in abnormal areas of enhancement. IMPRESSION: MRI scan of the brain with and without contrast showing age advanced generalized cerebral atrophy and mild changes of chronic small vessel  disease.  No acute abnormality or abnormal enhancing lesions are noted"  Family Neurologic/Medical Hx: Dementia and her sister (sister is her senior by 2 years), onset around at least 2 years ago.  Family History  Problem Relation Age of Onset   BRCA 1/2 Neg Hx    Breast cancer Neg Hx  Medications:  Asprin 81 mg Donepezil hcl 10 mg  Ezetimibe  10mg  Misc natural products.  Prevagen  Academic/Vocational History: Highest level of educational attainment: 12 History of developmental delay: None History of grade repetition: None Enrollment in special education courses: None History of LD/ADHD: None Employment: Stopped working approximately 7 years ago.  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  At one point also worked as [employer].  Psychosocial: Marital Status: Currently unmarried.  Three prior marriages. Children/Grandchildren: Two adult children and four grandchildren. Living Situation: Lives with son, daughter-in-law, and two grandchildren. Daily Activities/Hobbies: Coloring and drawing  NEUROPSYCHODIAGNOSTIC FINDINGS: Behavioral Observations: The patient was mostly oriented to self (able to provide name and date of birth, but was unable to provide age). She was not oriented to place or time. She was alert. Her hearing and vision were adequate for testing. She ambulated independently and without issue. No hand tremor was noted during testing. Her speech was mostly prosodic, fluent, and well-articulated. She displayed no clear indications of word-finding difficulties in conversational speech and no paraphasic errors were noted. Receptive language appeared intact. The patient's exhibited frequent tearfulness throughout the testing session, particularly when struggling to generate responses during tests. Although discussed, the patient expressed uncertainty about the purpose of testing. During the session, patient's son was contacted and spoke with the patient to provide reassurance,  encouragement, and to reinforce the purpose of testing. This helped reduce the patient's distress. The patient appeared to engage with testing to the best of her ability, although difficulties with emotion regulation and comprehending testing instructions on more complex tasks were notable. The patient was talkative at times throughout testing, sometimes during the test administration itself. There were indications of forgetfulness in conversation in that the patient repeatedly shared the same detail about her personal history, seemingly without awareness. Her attention was not easily redirected. Social interactions were otherwise unremarkable.   Tests Administered: Clock Drawing Test Controlled Oral Word Association Test (FAS & Animals) Grooved Pegboard Test Repeatable Battery for the Assessment of Neuropsychological Status Update (RBANS), Form C Trail Making Test (TMT; Part A & B)  Results: Note: Test scores are relative to age, gender, and educational history as available and appropriate.  Measurement properties of test scores: IQ, Index, and Standard Scores (SS): Mean = 100; Standard Deviation = 15; Scaled Scores (ss): Mean = 10; Standard Deviation = 3; Z scores (Z): Mean = 0; Standard Deviation = 1; T scores (T); Mean = 50; Standard Deviation = 10  Intellectual/Premorbid Functioning Estimate   Norm Score Percentile  Range  Wechsler Test of Adult Reading  SS = 71 3 %ile Below Average   ATTENTION AND WORKING MEMORY    Norm Score Percentile  Range  RBANS          Digit Span  ss = 4 2 %ile Below Average   PROCESSING SPEED       Range  RBANS          Coding       Discontinued (Unable to complete practice/learning trial)   PSYCHOMOTION    Norm Score Percentile  Range  Grooved Pegboard - Dominant  t = 20 0.1 %ile Exceptionally Low   # of drops    0     Grooved Pegboard - Nondominant  t = 22 0.2 %ile Exceptionally Low   # of drops    0      LANGUAGE    Norm Score Percentile  Range  COWAT  FAS  t = 18 0.1 %ile Exceptionally Low   Animals  t = 11 <0.1 %ile Exceptionally Low  RBANS           Picture Naming     <=2nd %ile  Exceptionally Low    Semantic Fluency  ss = 1 0.1 %ile Exceptionally Low   EXECUTIVE FUNCTIONING       Range  Trails A       Discontinued (Time Limit)   Errors         Trails B       Discontinued (Unable to progress)   Errors          MEMORY    Norm Score Percentile  Range  RBANS          Immediate Memory Index  SS = 40 <0.1 %ile Exceptionally Low   Delayed Memory Index  SS = 40 <0.1 %ile Exceptionally Low   List Learning  ss = 1 0.1 %ile Exceptionally Low   Story Memory  ss = 1 0.1 %ile Exceptionally Low   List Recall     <=2nd %ile %ile Exceptionally Low   List Recognition     <=2nd %ile %ile Exceptionally Low   Story Recall  ss = 1 0.1 %ile Exceptionally Low   Figure Recall  ss = 1 0.1 %ile Exceptionally Low   VISUAL-SPATIAL    Norm Score Percentile  Range  Clock                   RBANS Visuospatial Index  SS = 50 <0.1 %ile Exceptionally Low   RBANS Figure Copy  ss = 1 0.1 %ile Exceptionally Low   RBANS Line Orientation Attempted      <=2nd %ile %ile Exceptionally Low    SUMMARY / CLINICAL IMPRESSIONS The patient was referred for neuropsychological evaluation by primary care provider Yvonnie Heritage, NP, due to concerns for cognitive decline. Per medical records, the patient was previously receiving care from [facility] and her last appointment was on 07/05/2022. At that time, there were concerns for memory loss starting 1.5 years earlier. MoCA score was within the mild cognitive impairment range with 25 out of 30. B12 level and MRI was ordered. She was previously prescribed donepezil and this was continued. MRI of the brain on 07/25/2022 was reported to show "age advanced generalized cerebral atrophy and mild changes of chronic small vessel disease. B12 levels were reportedly normal. The patient was referred for memory testing  at that time but was never scheduled. The patient had an appointment with primary care / the referring provider on 07/10/2023. During that appointment more significant declines in cognitive and daily functioning were described by the patient's family.   Upon interview, the patient's son and HCPOA provided collateral. The patient limited awareness of her cognitive deficits and denied any significant cognitive concerns. Per collateral, there have been significant declines in the patient's cognitive functioning including prominent difficulties with short-term memory and frequent indications of disorientation and sometimes delusions. There were no clear indications of hallucination or RBDs. Symptoms are not clearly limited to distinct episodes of altered mental status or fluctuations of alertness. Behavioral changes are primarily related to increased psychological distress, dysphoria, anxiety, and declines in frustration tolerance and emotion regulation. There are no clear signs of marked inappropriate / disinhibited social interactions during visits for the evaluation. The patient is dependent on others for instrumental activities of daily living and also some basic activities of daily living. She currently  resides with her son and his family.   The patient's cognitive test profile shows evidence of marked, broad-based, cognitive impairment. Performances are well below expectations given her personal history that suggests an average range premorbid baseline, at minimum.  Test scores were at the l lower limit of the score range consistently with the exception of a below average range score on a basic attention measure (digit span) and a word-reading/recognition measure used for estimating premorbid abilities (not reliable estimate of premorbid functioning due to anomia).  Impairments were seen in memory, visual-spatial abilities, speeded motor dexterity, language functioning, and executive functioning/reasoning.   Several measures could not be completed due to impairments impacting the patient's ability to reliably learn and consistently follow the task instructions.   Diagnostically, the patient meets criteria for Major Neurocognitive Disorder (I.e., dementia) given evidence of cognitive decline and subsequent functional impairment in the patient's ability to safely care for herself independently. The homogeneous nature of the patient's cognitive profile most likely reflects the global cognitive decline that occurs with the progression of a neurodegenerative pathology. At this phase, deficit patterns in cognitive test data are indistinct and therefore offer limited value in determining the likely etiology of the decline. Similarly, behavioral challenges can overlap a good degree between neurodegenerative pathologies following progression. The writer defers to the patient's treatment team and family with respect to decisions regarding follow-up medical testing and considerations of utility. Recommendations by psychology will vary minimally based on etiology.     Diagnosis: Major Neurocognitive Disorder   Recommendations:  Follow up with the referring provider follow up with the referring provider as planned.  The writer defers to medical providers within the patient's treatment team with respect to any additional assessment for likely etiology.  As discussed with the patient's son during the feedback for the current evaluation, planning for future needs of the patient is strongly recommended. Cognitive and functional decline is expected to continue. Having discussions about at what point increased levels of care are needed, and what that care will look like, is strongly recommended.  The Alzheimer's disease organization has two chapter in [place]  and provides answers to many questions for patients and families on their website for any diagnosis of Major Neurocognitive Disorder. They also provide links to  caregiver support as well as invite members to local events. If interested, please visit: [email or web address]-[email or web address] Cognitive stimulation can be beneficial for reducing risk of increased rate of cognitive decline. This can take many forms. Sissy Duff is an excellent way to provide stimulation. Most activities which requires some level of concentration / cognitive engagement and active participation tends to be cognitively stimulating.  Some basic strategies in managing behavioral / functional challenges Functioning: Utilizing a consistent routine may be helpful. Simplify tasks. Limit clutter/distractions. Some people have found it helpful to write notes on a white board for the patient, somewhere they can easily see it.  Communication: Simplify requests/instructions (single step at a time). Allow extra time for responses.  Behavioral: Challenging the patient's assertions / statements reflecting confusion can sometimes increase agitation. Providing validation, reassurance, and distraction can be beneficial at times.  Please contact me if you have any additional questions should they arise, or any questions about anything in this report.    This report was generated using voice recognition software. While this document has been carefully reviewed, transcription errors may be present. I apologize in advance for any inconvenience. Please contact me if further clarification is needed.    Loletta Ripple, PsyD Neuropsychologist

## 2023-10-10 ENCOUNTER — Ambulatory Visit: Admitting: Neurology

## 2023-10-10 ENCOUNTER — Encounter: Payer: Self-pay | Admitting: Neurology

## 2023-10-10 VITALS — BP 146/89 | HR 84 | Ht 60.0 in | Wt 136.6 lb

## 2023-10-10 DIAGNOSIS — F03918 Unspecified dementia, unspecified severity, with other behavioral disturbance: Secondary | ICD-10-CM | POA: Diagnosis not present

## 2023-10-10 MED ORDER — MEMANTINE HCL 5 MG PO TABS: 5.0000 mg | ORAL_TABLET | Freq: Two times a day (BID) | ORAL | 5 refills | Status: DC

## 2023-10-10 NOTE — Progress Notes (Signed)
 Subjective:    Patient ID: Brandy Ford is a 73 y.o. female.  HPI    Interim history:   Ms. Brandy Ford is a 73 year old female with an underlying medical history of hypertension, hyperlipidemia, and memory loss, who presents for follow-up consultation of her memory loss.  The patient is accompanied by her son, Brandy Ford, who is her HCPOA (he is the older of 2 sons) today.  She had previously seen Dr. Dala Dublin in this office and was evaluated on 07/05/2018 for.  I reviewed the office visit note and copied the note below for reference.  Today, 10/10/2023: She reports doing well, she was somewhat tearful during the MMSE testing.  She has had some bouts of depression and anxiety per son.  Her history is primarily provided by her son.  He reports that she moved in with him and his family in January 2025 as patient was living with her significant other prior to that and things were not going well.  She has not driven in months.  She has settled in quite well.  She does not have her own room at his place.  She reports hydrating well.  She eats well.  She reports sleeping well.  She has a family history of dementia affecting her older sister.  Patient had some smoking in the distant past but was never a regular smoker.  She drinks alcohol once daily in the form of 1 glass of wine.  She has had some hallucinations and delusions while on donepezil and it was stopped about a week ago by her PCP nurse practitioner.  They had also talked about addressing her depression and anxiety but she is currently not on any medication for these.  She has not fallen recently.  She tries to stay active by walking.  She reports that she likes to exercise. She had a head CT without contrast on 03/25/2020 for indication of memory loss.  I reviewed the report:  IMPRESSION: 1. No evidence of acute intracranial abnormality. 2. No evidence of a reversible cause of memory loss.   She had a brain MRI with and without contrast  on 07/25/2022 and I reviewed the results:      IMPRESSION: MRI scan of the brain with and without contrast showing age advanced generalized cerebral atrophy and mild changes of chronic small vessel disease.  No acute abnormality or abnormal enhancing lesions are noted.   In addition, I personally and independently reviewed images through the PACS system. She had neuropsychological evaluation with Dr. Aleene Hurry on 09/24/2023 and a follow-up appointment on 09/28/2023, with the son (only).  I reviewed the visit notes and impression as well as recommendations:  << Diagnosis: Major Neurocognitive Disorder     Recommendations:  1. Follow up with the referring provider follow up with the referring provider as planned.  The writer defers to medical providers within the patient's treatment team with respect to any additional assessment for likely etiology.  2. As discussed with the patient's son during the feedback for the current evaluation, planning for future needs of the patient is strongly recommended. Cognitive and functional decline is expected to continue. Having discussions about at what point increased levels of care are needed, and what that care will look like, is strongly recommended.  3. The Alzheimer's disease organization has two chapter in [place]  and provides answers to many questions for patients and families on their website for any diagnosis of Major Neurocognitive Disorder. They also provide links to caregiver  support as well as invite members to local events. If interested, please visit: [email or web address] 4. Cognitive stimulation can be beneficial for reducing risk of increased rate of cognitive decline. This can take many forms. Sissy Duff is an excellent way to provide stimulation. Most activities which requires some level of concentration / cognitive engagement and active participation tends to be cognitively stimulating.  5. Some basic strategies in  managing behavioral / functional challenges a. Functioning: Utilizing a consistent routine may be helpful. Simplify tasks. Limit clutter/distractions. Some people have found it helpful to write notes on a white board for the patient, somewhere they can easily see it.  b. Communication: Simplify requests/instructions (single step at a time). Allow extra time for responses.  c. Behavioral: Challenging the patient's assertions / statements reflecting confusion can sometimes increase agitation. Providing validation, reassurance, and distraction can be beneficial at times.  6. Please contact me if you have any additional questions should they arise, or any questions about anything in this report.    >>  Previous laboratory testing included vitamin B12 level on 07/05/2022 which was on the lower end of normal at 371. She was on donepezil 10 mg once daily.  Her MoCA on 07/05/2022 was 25 out of 30.    The patient's allergies, current medications, family history, past medical history, past social history, past surgical history and problem list were reviewed and updated as appropriate.   Previously:  07/05/2022 (Dr. Dala Dublin): <<The patient presents for evaluation of memory loss which has been present over the past 1.5 years. She does feel like her memory has been improving over time. Has good days and bad days. Feels her memory issues are mostly associated with anxiety and stress. Gets confused when she is pressured to do things quickly. Family members have told her that her she repeats herself. Has not noticed any difficulty with her ADLs. Does not drive or handle finances, which is not new for her. She is managing her own medications and cooking without issues. She does have some stress in her life as she takes care of her special needs granddaughter. States she sometimes just "wants to get away", and is planning a beach trip soon.    She has been taking donepezil for the past 3 months and recently  increased the dose to 10 mg daily. She does feel this has helped her memory.   She had a [facility] 03/2020 which showed generalized cerebral volume loss and mild chronic microvascular disease.   Her older sister has recently been having some memory issues. Otherwise no known family history of dementia.   TBI:  No past history of TBI Stroke:  no past history of stroke Seizures:  no past history of seizures Sleep: no history of sleep apnea.  Sleeps well at night. Mood: She denies depression. Does note that she sometimes feels her "son doesn't seem to care about her", which makes her feel sad. Memory seems worse when she is anxious or overwhelmed. Could not complete memory testing with her PCP due to anxiety and distress.   Functional status:  Patient lives with her husband Cooking: no issues Cleaning: no issues Shopping: goes shopping with her husband Driving: she doesn't drive, hasn't driven for a long time Bills: husband has always handled the bills Medications: handles her medications without issues, uses a pillbox Ever left the stove on by accident?: no issues Getting lost going to familiar places?: no Forgetting loved ones names?: no Word finding difficulty? sometimes   >>  Her  Past Medical History Is Significant For: Past Medical History:  Diagnosis Date   High blood cholesterol    Hypertension     Her Past Surgical History Is Significant For: Past Surgical History:  Procedure Laterality Date   CESAREAN SECTION      Her Family History Is Significant For: Family History  Problem Relation Age of Onset   Dementia Sister    BRCA 1/2 Neg Hx    Breast cancer Neg Hx     Her Social History Is Significant For: Social History   Socioeconomic History   Marital status: Divorced    Spouse name: Not on file   Number of children: Not on file   Years of education: Not on file   Highest education level: Not on file  Occupational History   Not on file  Tobacco Use   Smoking  status: Never   Smokeless tobacco: Not on file  Vaping Use   Vaping status: Not on file  Substance and Sexual Activity   Alcohol use: Yes    Alcohol/week: 7.0 standard drinks of alcohol    Types: 7 Glasses of wine per week   Drug use: No   Sexual activity: Not on file  Other Topics Concern   Not on file  Social History Narrative   Pt lives with family    Retired    [employer] Strain: Not on file  Food Insecurity: Not on file  Transportation Needs: Not on file  Physical Activity: Not on file  Stress: Not on file  Social Connections: Not on file    Her Allergies Are:  Allergies  Allergen Reactions   Pravastatin Sodium     Headaches and extreme muscle cramps  :   Her Current Medications Are:  Outpatient Encounter Medications as of 10/10/2023  Medication Sig   Apoaequorin (PREVAGEN PO) Take by mouth.   aspirin EC 81 MG tablet Take 81 mg by mouth daily.   ezetimibe  (ZETIA ) 10 MG tablet take 1 tablet by mouth once daily   Multiple Vitamin (MULTIVITAMIN WITH MINERALS) TABS tablet Take 1 tablet by mouth daily.   amLODipine  (NORVASC ) 5 MG tablet Take 1 tablet (5 mg total) by mouth daily. (Patient not taking: Reported on 07/05/2022)   donepezil (ARICEPT) 10 MG tablet Take 10 mg by mouth at bedtime. (Patient not taking: Reported on 10/10/2023)   No facility-administered encounter medications on file as of 10/10/2023.  :  Review of Systems:  Out of a complete 14 point review of systems, all are reviewed and negative with the exception of these symptoms as listed below:  Review of Systems  Neurological:        Pt here for memory f/u son states short and long term memory is worse     Objective:  Neurological Exam  Physical Exam Physical Examination:   Vitals:   10/10/23 0808  BP: (!) 146/89  Pulse: 84   General Examination: The patient is a very pleasant 73 y.o. female in no acute distress. She appears mildly anxious.   HEENT:  Normocephalic, atraumatic, pupils are equal, round and reactive to light, extraocular tracking is good without limitation to gaze excursion or nystagmus noted. Hearing is grossly intact. Face is symmetric with normal facial animation. Speech is clear with no dysarthria noted. There is no hypophonia. There is no lip, neck/head, jaw or voice tremor. Neck is supple with full range of passive and active motion. There are no carotid bruits on auscultation.  Oropharynx exam reveals: No significant mouth dryness, adequate dental hygiene, tongue protrudes centrally and palate elevates symmetrically.   Chest: Clear to auscultation without wheezing, rhonchi or crackles noted.  Heart: S1+S2+0, regular and normal without murmurs, rubs or gallops noted.   Abdomen: Soft, non-tender and non-distended.  Extremities: There is no pitting edema in the distal lower extremities bilaterally.   Skin: Warm and dry without trophic changes noted.   Musculoskeletal: exam reveals no obvious joint deformities.   Neurologically:  Mental status: The patient is awake, pays attention.  She is not able to give a very detailed history, history is primarily provided by her son.  She is somewhat anxious appearing and becomes tearful during the memory testing.      10/10/2023    8:22 AM  MMSE - Mini Mental State Exam  Orientation to time 0  Orientation to Place 1  Registration 3  Attention/ Calculation 0  Recall 3  Language- name 2 objects 1  Language- repeat 0  Language- follow 3 step command 3  Language- read & follow direction 0  Write a sentence 0  Copy design 0  Total score 11   On 10/10/2023: CDT: 1/5. Cranial nerves II - XII are as described above under HEENT exam.  Motor exam: Normal bulk, strength and tone is noted. There is no obvious action or resting tremor.  Fine motor skills and coordination: grossly intact.  Cerebellar testing: No dysmetria or intention tremor. There is no truncal or gait ataxia.  Sensory  exam: intact to light touch in the upper and lower extremities.  Reflexes are 2+ throughout. Gait, station and balance: She stands easily. No veering to one side is noted. No leaning to one side is noted. Posture is age-appropriate and stance is narrow based. Gait shows normal stride length and normal pace. No problems turning are noted.   Assessment and Plan:  In summary, Ryder Chesmore is a very pleasant 73 year old female with an underlying medical history of hypertension, hyperlipidemia, and memory loss, who presents for follow-up consultation of her progressive memory loss.  She has likely Alzheimer's dementia with some evidence of behavioral changes including hallucinations and delusions and symptoms of anxiety and depression.  We had a long discussion today.  She is advised to stay off the donepezil but we can certainly reintroduce it later on if we wanted to try it again.  For now, I would like for her to start Namenda 5 mg strength twice daily with the plan to increase it to 10 mg twice daily in about a month.  She is advised to stay well-hydrated and well rested, we talked about the importance of maintaining a healthy lifestyle and how important a good support system is.  We talked about the importance of physical activity.  She is advised to stop drinking alcohol altogether, especially in preparation for starting memantine.  She is advised to make an appointment with her primary care nurse practitioner or physician to discuss her symptoms of depression and anxiety and address these symptoms if needed with an antidepressant. We will plan to follow-up in this clinic in about 6 months, sooner if needed.  I answered all their questions today and the patient and her son were in agreement. I spent 45 minutes in total face-to-face time and in reviewing records during pre-charting, more than 50% of which was spent in counseling and coordination of care, reviewing test results, reviewing medications and  treatment regimen and/or in discussing or reviewing the diagnosis of  dementia, the prognosis and treatment options. Pertinent laboratory and imaging test results that were available during this visit with the patient were reviewed by me and considered in my medical decision making (see chart for details).

## 2023-10-10 NOTE — Patient Instructions (Addendum)
 We will start you for your memory on Namenda (generic name: Memantine), starting at 5 mg twice daily with gradual buildup to 10 mg twice daily. Please note that side effects may include, but are not limited to: nausea, confusion, hallucination, personality changes. If you are having mild side effects, try to stick with the treatment as these initial side effects may go away after the first 10-14 days.  Please avoid drinking alcohol altogether.  You can check in with us  through MyChart messaging in about a month and we will increase your Namenda to 10 mg twice daily at the time.  Please make an appointment for follow-up with your primary care nurse practitioner to discuss symptoms of depression and anxiety and the possible treatment options.

## 2023-10-25 ENCOUNTER — Encounter: Payer: Self-pay | Admitting: Neurology

## 2023-11-01 ENCOUNTER — Encounter: Payer: Self-pay | Admitting: Neurology

## 2023-11-28 ENCOUNTER — Encounter: Payer: Self-pay | Admitting: Neurology

## 2023-11-29 ENCOUNTER — Other Ambulatory Visit: Payer: Self-pay | Admitting: *Deleted

## 2023-11-29 MED ORDER — MEMANTINE HCL 10 MG PO TABS: 10.0000 mg | ORAL_TABLET | Freq: Two times a day (BID) | ORAL | 3 refills | Status: AC

## 2023-11-29 MED ORDER — MEMANTINE HCL 10 MG PO TABS: 10.0000 mg | ORAL_TABLET | Freq: Two times a day (BID) | ORAL | 3 refills | Status: DC

## 2023-11-29 NOTE — Telephone Encounter (Signed)
 I called son, Auston, pt has been off alcohol.  She can start taking the memantine  10mg  po bid.  He wanted the [facility] in [place],  I sent in script and cancelled the HT script.  He appreciated call back.

## 2023-11-29 NOTE — Telephone Encounter (Signed)
 As long as she has stopped drinking alcohol altogether, and tolerates the memantine  at 5 mg bid, we can increase to 10 mg bid. Please call son to verify this. I will go ahead and send the Rx.

## 2024-04-05 ENCOUNTER — Encounter: Payer: Self-pay | Admitting: Neurology

## 2024-04-07 ENCOUNTER — Ambulatory Visit: Admitting: Neurology

## 2024-04-07 NOTE — Telephone Encounter (Signed)
 Called and LVM for son to call back and r/s. Appt has been cx.
